# Patient Record
Sex: Female | Born: 1950
Health system: Southern US, Community
[De-identification: ages and names within clinical notes are randomized; demographics above are authoritative.]

## PROBLEM LIST (undated history)

## (undated) DIAGNOSIS — D649 Anemia, unspecified: Secondary | ICD-10-CM

## (undated) DIAGNOSIS — I10 Essential (primary) hypertension: Secondary | ICD-10-CM

## (undated) DIAGNOSIS — K219 Gastro-esophageal reflux disease without esophagitis: Secondary | ICD-10-CM

## (undated) DIAGNOSIS — H18519 Endothelial corneal dystrophy, unspecified eye: Secondary | ICD-10-CM

## (undated) DIAGNOSIS — Z8619 Personal history of other infectious and parasitic diseases: Secondary | ICD-10-CM

## (undated) DIAGNOSIS — J309 Allergic rhinitis, unspecified: Secondary | ICD-10-CM

## (undated) DIAGNOSIS — M1990 Unspecified osteoarthritis, unspecified site: Secondary | ICD-10-CM

## (undated) HISTORY — DX: Anemia, unspecified: D64.9

## (undated) HISTORY — DX: Gastro-esophageal reflux disease without esophagitis: K21.9

## (undated) HISTORY — DX: Essential (primary) hypertension: I10

## (undated) HISTORY — DX: Allergic rhinitis, unspecified: J30.9

## (undated) HISTORY — PX: COLONOSCOPY: SHX174

## (undated) HISTORY — DX: Unspecified osteoarthritis, unspecified site: M19.90

## (undated) HISTORY — DX: Personal history of other infectious and parasitic diseases: Z86.19

---

## 2005-01-18 ENCOUNTER — Ambulatory Visit (HOSPITAL_COMMUNITY): Admission: RE | Admit: 2005-01-18 | Discharge: 2005-01-18 | Payer: Self-pay | Admitting: Obstetrics and Gynecology

## 2005-03-27 ENCOUNTER — Ambulatory Visit: Payer: Self-pay | Admitting: Endocrinology

## 2005-04-02 ENCOUNTER — Ambulatory Visit: Payer: Self-pay | Admitting: Endocrinology

## 2006-05-13 ENCOUNTER — Ambulatory Visit (HOSPITAL_COMMUNITY): Admission: RE | Admit: 2006-05-13 | Discharge: 2006-05-13 | Payer: Self-pay | Admitting: Obstetrics and Gynecology

## 2006-05-13 ENCOUNTER — Ambulatory Visit: Payer: Self-pay | Admitting: Endocrinology

## 2006-05-14 ENCOUNTER — Ambulatory Visit: Payer: Self-pay | Admitting: Endocrinology

## 2006-06-04 ENCOUNTER — Ambulatory Visit: Payer: Self-pay | Admitting: Internal Medicine

## 2007-05-15 ENCOUNTER — Encounter: Payer: Self-pay | Admitting: Endocrinology

## 2007-05-15 DIAGNOSIS — D649 Anemia, unspecified: Secondary | ICD-10-CM

## 2007-05-15 DIAGNOSIS — J309 Allergic rhinitis, unspecified: Secondary | ICD-10-CM | POA: Insufficient documentation

## 2007-05-19 ENCOUNTER — Ambulatory Visit: Payer: Self-pay | Admitting: Endocrinology

## 2007-05-19 LAB — CONVERTED CEMR LAB
ALT: 18 units/L (ref 0–35)
Albumin: 3.8 g/dL (ref 3.5–5.2)
Alkaline Phosphatase: 81 units/L (ref 39–117)
BUN: 8 mg/dL (ref 6–23)
Bacteria, UA: NEGATIVE
Basophils Absolute: 0 10*3/uL (ref 0.0–0.1)
Bilirubin Urine: NEGATIVE
Bilirubin, Direct: 0.1 mg/dL (ref 0.0–0.3)
Calcium: 9.2 mg/dL (ref 8.4–10.5)
Cholesterol: 180 mg/dL (ref 0–200)
Crystals: NEGATIVE
Eosinophils Absolute: 0.1 10*3/uL (ref 0.0–0.6)
Eosinophils Relative: 1.2 % (ref 0.0–5.0)
GFR calc non Af Amer: 110 mL/min
Hep B S Ab: NEGATIVE
Ketones, ur: NEGATIVE mg/dL
Lymphocytes Relative: 33.4 % (ref 12.0–46.0)
MCV: 86.6 fL (ref 78.0–100.0)
Nitrite: NEGATIVE
RBC: 4.51 M/uL (ref 3.87–5.11)
Sed Rate: 32 mm/hr — ABNORMAL HIGH (ref 0–25)
Total Bilirubin: 0.5 mg/dL (ref 0.3–1.2)
Total CHOL/HDL Ratio: 3.8
Total Protein, Urine: NEGATIVE mg/dL
Total Protein: 7.3 g/dL (ref 6.0–8.3)
Urine Glucose: NEGATIVE mg/dL
Urobilinogen, UA: 0.2 (ref 0.0–1.0)
VLDL: 30 mg/dL (ref 0–40)
WBC: 7.8 10*3/uL (ref 4.5–10.5)
pH: 5.5 (ref 5.0–8.0)

## 2007-07-15 ENCOUNTER — Telehealth (INDEPENDENT_AMBULATORY_CARE_PROVIDER_SITE_OTHER): Payer: Self-pay | Admitting: *Deleted

## 2007-09-24 ENCOUNTER — Ambulatory Visit: Payer: Self-pay | Admitting: Endocrinology

## 2007-10-22 ENCOUNTER — Ambulatory Visit: Payer: Self-pay | Admitting: Endocrinology

## 2007-11-27 ENCOUNTER — Ambulatory Visit: Payer: Self-pay | Admitting: Endocrinology

## 2007-11-27 DIAGNOSIS — Z780 Asymptomatic menopausal state: Secondary | ICD-10-CM | POA: Insufficient documentation

## 2007-11-27 DIAGNOSIS — M25569 Pain in unspecified knee: Secondary | ICD-10-CM

## 2007-11-27 DIAGNOSIS — I10 Essential (primary) hypertension: Secondary | ICD-10-CM | POA: Insufficient documentation

## 2007-12-01 ENCOUNTER — Encounter: Payer: Self-pay | Admitting: Endocrinology

## 2007-12-02 ENCOUNTER — Encounter: Payer: Self-pay | Admitting: Endocrinology

## 2007-12-02 ENCOUNTER — Ambulatory Visit: Payer: Self-pay | Admitting: Family Medicine

## 2007-12-03 ENCOUNTER — Ambulatory Visit: Payer: Self-pay | Admitting: Endocrinology

## 2007-12-03 ENCOUNTER — Ambulatory Visit (HOSPITAL_COMMUNITY): Admission: RE | Admit: 2007-12-03 | Discharge: 2007-12-03 | Payer: Self-pay | Admitting: Obstetrics and Gynecology

## 2007-12-06 LAB — CONVERTED CEMR LAB
ALT: 15 units/L (ref 0–35)
AST: 21 units/L (ref 0–37)
Basophils Absolute: 0 10*3/uL (ref 0.0–0.1)
Basophils Relative: 0.8 % (ref 0.0–1.0)
Bilirubin Urine: NEGATIVE
CO2: 29 meq/L (ref 19–32)
Cholesterol: 191 mg/dL (ref 0–200)
Eosinophils Absolute: 0.1 10*3/uL (ref 0.0–0.6)
Eosinophils Relative: 2 % (ref 0.0–5.0)
GFR calc non Af Amer: 92 mL/min
HCT: 41.2 % (ref 36.0–46.0)
HDL: 55.9 mg/dL (ref >39.0)
Hemoglobin: 13.6 g/dL (ref 12.0–15.0)
Ketones, ur: NEGATIVE mg/dL
LDL Cholesterol: 115 mg/dL — ABNORMAL HIGH (ref 0–99)
Leukocytes, UA: NEGATIVE
MCHC: 33 g/dL (ref 30.0–36.0)
MCV: 88.2 fL (ref 78.0–100.0)
Neutro Abs: 3.6 10*3/uL (ref 1.4–7.7)
Neutrophils Relative %: 58.9 % (ref 43.0–77.0)
Nitrite: NEGATIVE
Potassium: 5 meq/L (ref 3.5–5.1)
RDW: 12 % (ref 11.5–14.6)
Sed Rate: 18 mm/hr (ref 0–25)
Specific Gravity, Urine: 1.025 (ref 1.000–1.03)
TSH: 0.96 microintl units/mL (ref 0.35–5.50)
Total Bilirubin: 0.6 mg/dL (ref 0.3–1.2)
Total Protein, Urine: NEGATIVE mg/dL
Total Protein: 7.2 g/dL (ref 6.0–8.3)
Uric Acid, Serum: 5.3 mg/dL (ref 2.4–7.0)
VLDL: 20 mg/dL (ref 0–40)

## 2007-12-15 ENCOUNTER — Telehealth (INDEPENDENT_AMBULATORY_CARE_PROVIDER_SITE_OTHER): Payer: Self-pay | Admitting: *Deleted

## 2007-12-30 ENCOUNTER — Encounter: Payer: Self-pay | Admitting: Endocrinology

## 2008-03-17 ENCOUNTER — Ambulatory Visit: Payer: Self-pay | Admitting: Endocrinology

## 2008-03-17 ENCOUNTER — Telehealth (INDEPENDENT_AMBULATORY_CARE_PROVIDER_SITE_OTHER): Payer: Self-pay | Admitting: *Deleted

## 2008-03-17 DIAGNOSIS — N3000 Acute cystitis without hematuria: Secondary | ICD-10-CM | POA: Insufficient documentation

## 2008-03-17 LAB — CONVERTED CEMR LAB
Glucose, Urine, Semiquant: NEGATIVE
Protein, U semiquant: NEGATIVE
Urobilinogen, UA: 0.2

## 2008-03-18 ENCOUNTER — Encounter: Payer: Self-pay | Admitting: Endocrinology

## 2008-05-27 ENCOUNTER — Ambulatory Visit: Payer: Self-pay | Admitting: Endocrinology

## 2008-07-15 ENCOUNTER — Ambulatory Visit: Payer: Self-pay | Admitting: Endocrinology

## 2009-03-04 IMAGING — MG MM DIGITAL SCREENING BILAT
4 series · 4 of 4 positions shown · non-contrast
Comparison: Prior studies.

DG SCREEN MAMMOGRAM BILATERAL
Bilateral CC and MLO view(s) were taken.

DIGITAL SCREENING MAMMOGRAM WITH CAD:

[R CC]
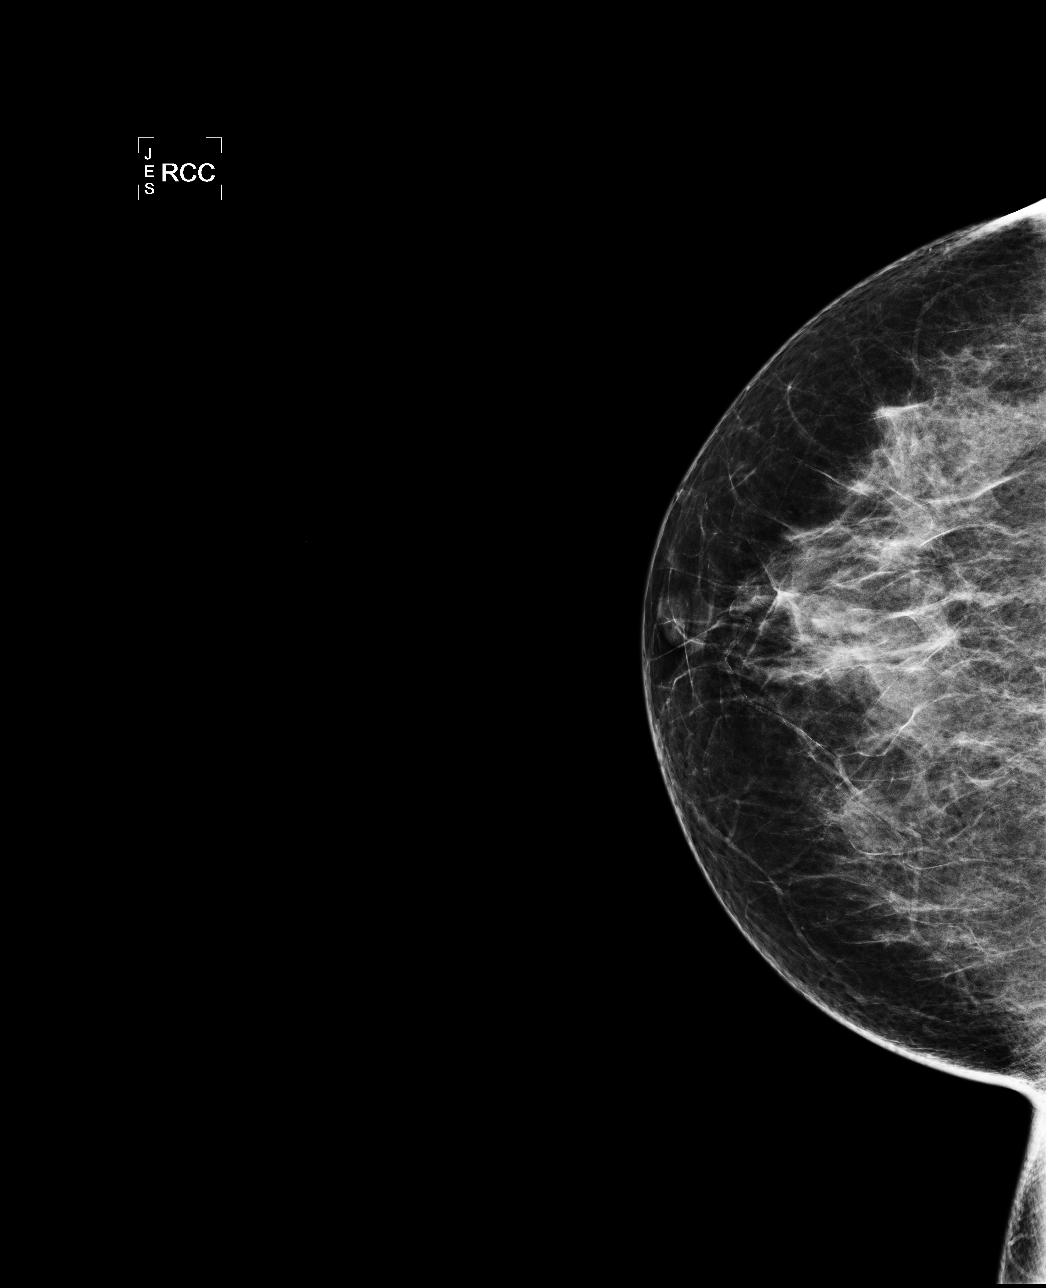

[R MLO]
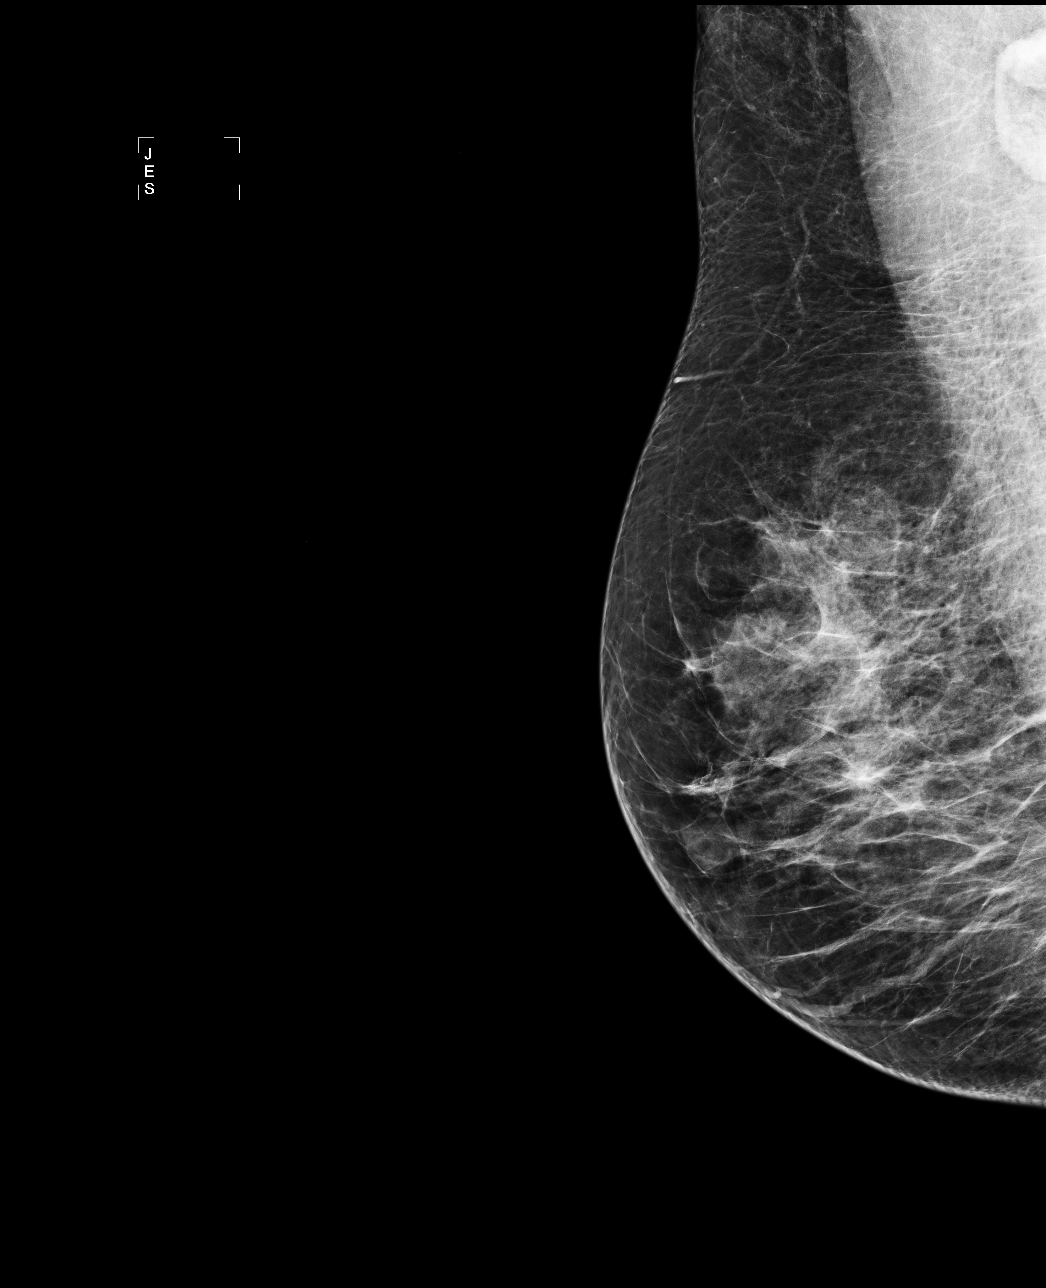

[L CC]
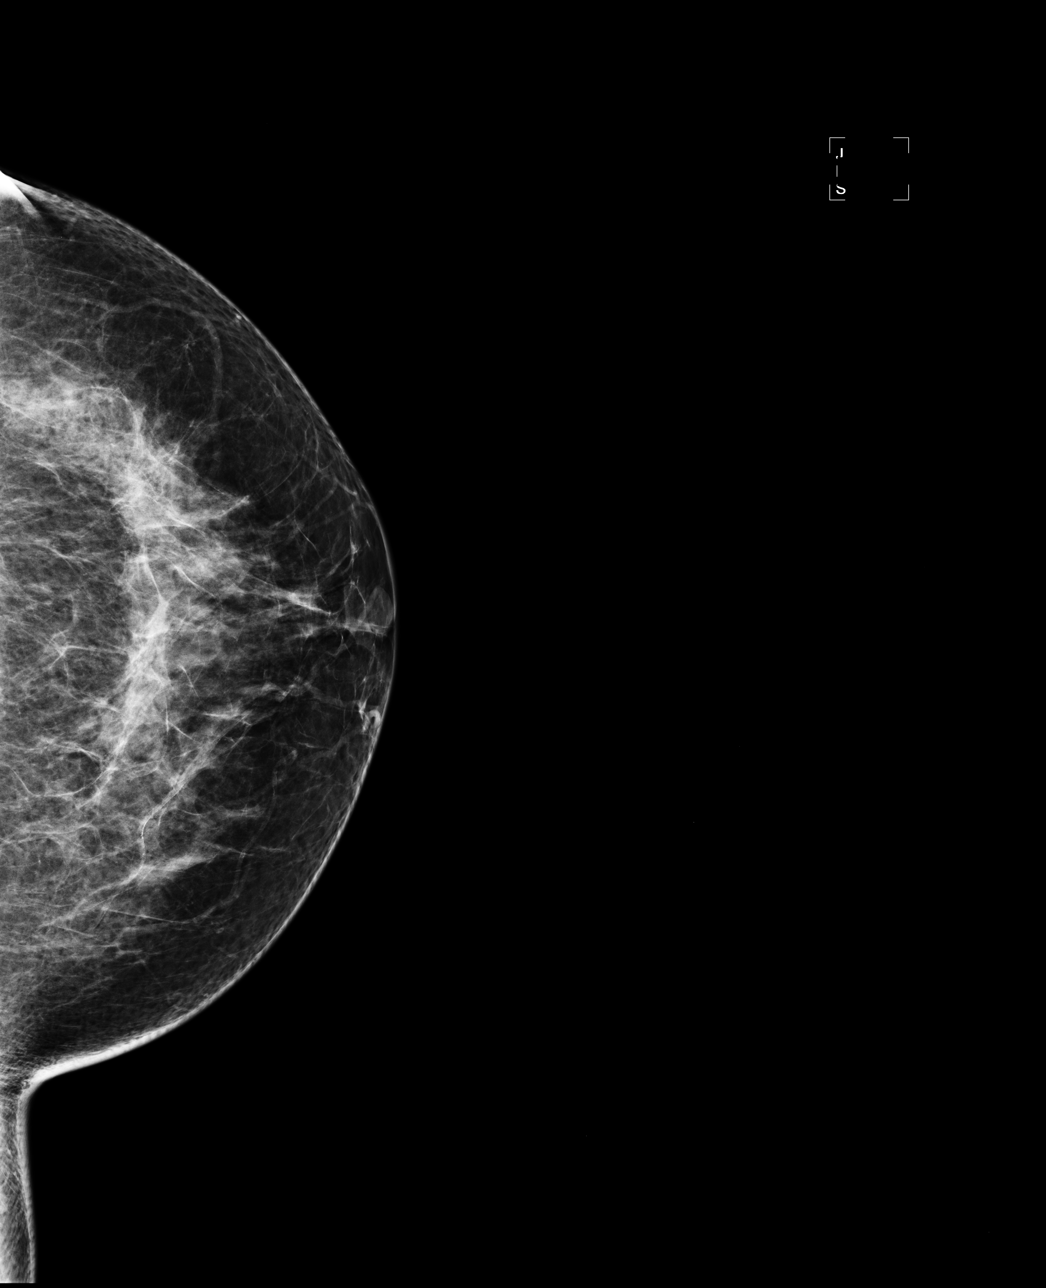

[L MLO]
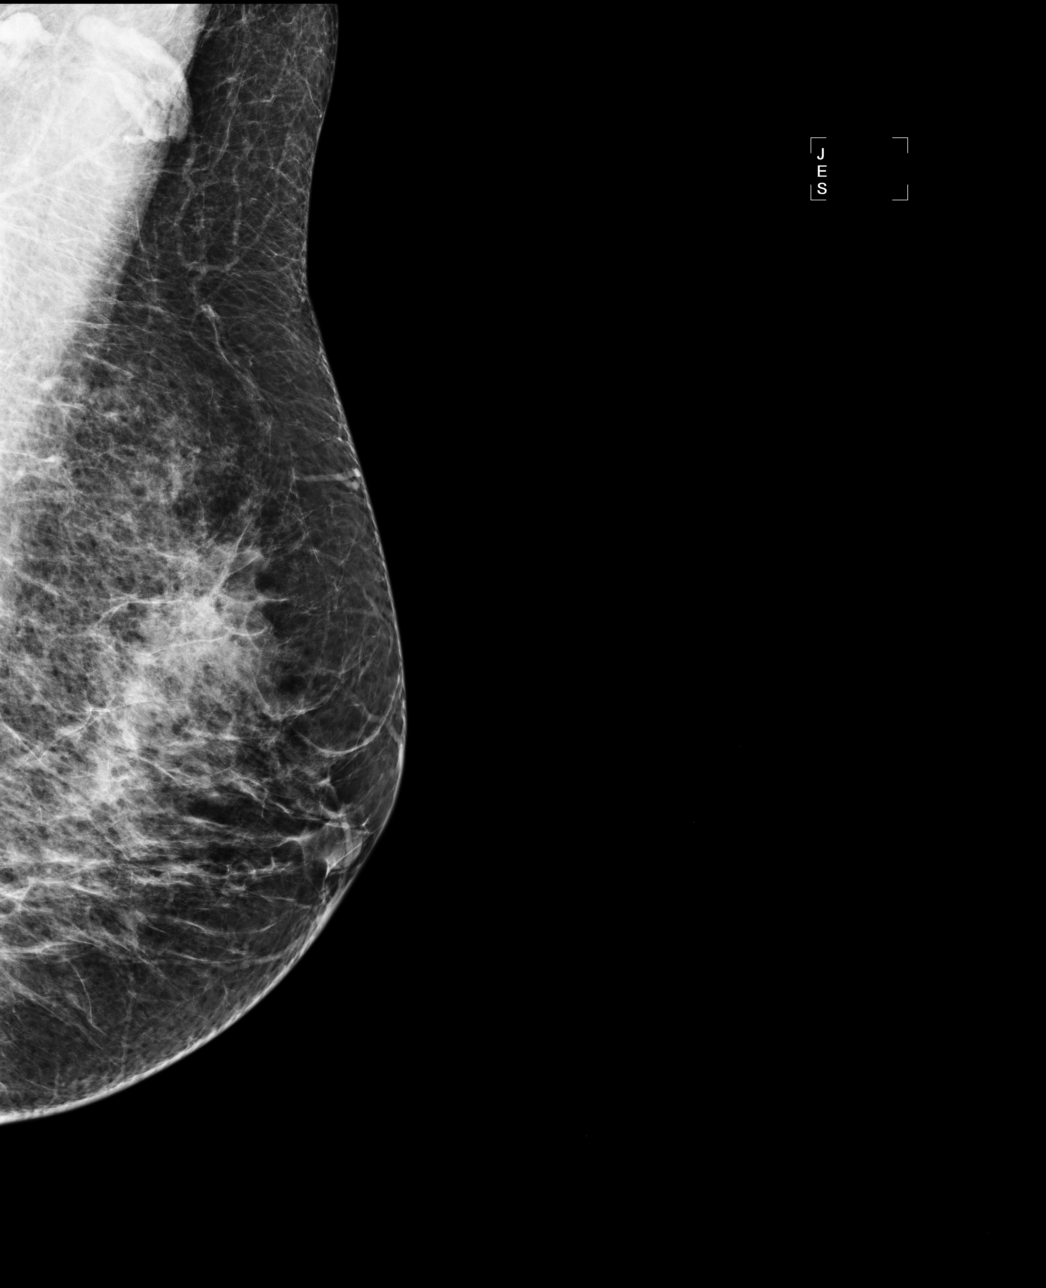

[4 of 4 positions shown; findings below may reference images not displayed]

The breast tissue is heterogeneously dense.  There is no dominant mass, architectural distortion or
calcification to suggest malignancy.
IMPRESSION: No mammographic evidence of malignancy.  Suggest yearly screening mammography.

ASSESSMENT: Negative - BI-RADS 1

Screening mammogram in 1 year.
ANALYZED BY COMPUTER AIDED DETECTION. , THIS PROCEDURE WAS A DIGITAL MAMMOGRAM.

## 2010-10-07 ENCOUNTER — Encounter: Payer: Self-pay | Admitting: Obstetrics and Gynecology

## 2010-10-08 ENCOUNTER — Encounter: Payer: Self-pay | Admitting: Obstetrics and Gynecology

## 2010-10-16 NOTE — Progress Notes (Signed)
Summary: rx needed  Phone Note Call from Patient Call back at Home Phone [phone] Call back at [phone]/cell daughter   Caller: Daughter Summary of Call: pt daughter requesting an rx for alrex for eye allgeries.and lotrisone for her itching under her arm pits .Patient's chart has been requested.  Initial call taken by: Shelbie Proctor,  July 15, 2007 9:55 AM  Follow-up for Phone Call        please offer ov sat 07/18/07 Follow-up by: Minus Breeding MD,  July 17, 2007 3:19 PM  Additional Follow-up for Phone Call Additional follow up Details #1::        called  daughter  per pt daughter can not bring her in sat  she is working the daughter  Additional Follow-up by: Shelbie Proctor,  July 17, 2007 4:32 PM

## 2011-10-11 ENCOUNTER — Telehealth: Payer: Self-pay | Admitting: Endocrinology

## 2011-10-11 NOTE — Telephone Encounter (Signed)
Daughter is aware and has made an appt for Jan 29.  Aware Medicaid must be Reg. And not [place] [facility].

## 2011-10-11 NOTE — Telephone Encounter (Signed)
Kristilyn Coltrane last was seen 03-17-2008.  The daughter thinks Dr Everardo All was her PCP.  She has just gotten a new Medicaid card.  The daughter states it is reg. Medicaid and not [place] [facility].  Is it ok for her to re-establish for primary care.

## 2011-10-11 NOTE — Telephone Encounter (Signed)
ok 

## 2011-10-15 ENCOUNTER — Ambulatory Visit (INDEPENDENT_AMBULATORY_CARE_PROVIDER_SITE_OTHER): Payer: Medicaid Other | Admitting: Endocrinology

## 2011-10-15 ENCOUNTER — Ambulatory Visit (INDEPENDENT_AMBULATORY_CARE_PROVIDER_SITE_OTHER)
Admission: RE | Admit: 2011-10-15 | Discharge: 2011-10-15 | Disposition: A | Discharge status: Home / Self Care | Payer: Medicaid Other | Source: Ambulatory Visit | Attending: Endocrinology | Admitting: Endocrinology

## 2011-10-15 ENCOUNTER — Other Ambulatory Visit (INDEPENDENT_AMBULATORY_CARE_PROVIDER_SITE_OTHER): Payer: Medicaid Other

## 2011-10-15 ENCOUNTER — Encounter: Payer: Self-pay | Admitting: Endocrinology

## 2011-10-15 DIAGNOSIS — Z780 Asymptomatic menopausal state: Secondary | ICD-10-CM

## 2011-10-15 DIAGNOSIS — M25569 Pain in unspecified knee: Secondary | ICD-10-CM

## 2011-10-15 DIAGNOSIS — N951 Menopausal and female climacteric states: Secondary | ICD-10-CM

## 2011-10-15 DIAGNOSIS — H04129 Dry eye syndrome of unspecified lacrimal gland: Secondary | ICD-10-CM

## 2011-10-15 DIAGNOSIS — I10 Essential (primary) hypertension: Secondary | ICD-10-CM

## 2011-10-15 DIAGNOSIS — M542 Cervicalgia: Secondary | ICD-10-CM

## 2011-10-15 DIAGNOSIS — D649 Anemia, unspecified: Secondary | ICD-10-CM

## 2011-10-15 DIAGNOSIS — Z79899 Other long term (current) drug therapy: Secondary | ICD-10-CM

## 2011-10-15 LAB — HEPATIC FUNCTION PANEL
AST: 23 U/L (ref 0–37)
Albumin: 4.1 g/dL (ref 3.5–5.2)
Bilirubin, Direct: 0 mg/dL (ref 0.0–0.3)
Total Bilirubin: 0.5 mg/dL (ref 0.3–1.2)
Total Protein: 7.8 g/dL (ref 6.0–8.3)

## 2011-10-15 LAB — URINALYSIS, ROUTINE W REFLEX MICROSCOPIC
Ketones, ur: NEGATIVE
Leukocytes, UA: NEGATIVE
Specific Gravity, Urine: 1.01 (ref 1.000–1.030)
Total Protein, Urine: NEGATIVE
Urine Glucose: NEGATIVE
Urobilinogen, UA: 0.2 (ref 0.0–1.0)
pH: 7 (ref 5.0–8.0)

## 2011-10-15 LAB — CBC WITH DIFFERENTIAL/PLATELET
Eosinophils Absolute: 0.1 10*3/uL (ref 0.0–0.7)
Hemoglobin: 13.4 g/dL (ref 12.0–15.0)
Lymphs Abs: 1.8 10*3/uL (ref 0.7–4.0)
MCHC: 34.1 g/dL (ref 30.0–36.0)
Monocytes Absolute: 0.5 10*3/uL (ref 0.1–1.0)
Neutro Abs: 5.2 10*3/uL (ref 1.4–7.7)
RBC: 4.36 Mil/uL (ref 3.87–5.11)
RDW: 12.6 % (ref 11.5–14.6)
WBC: 7.7 10*3/uL (ref 4.5–10.5)

## 2011-10-15 LAB — IBC PANEL
Saturation Ratios: 23.8 % (ref 20.0–50.0)
Transferrin: 270 mg/dL (ref 212.0–360.0)

## 2011-10-15 LAB — TSH: TSH: 0.97 u[IU]/mL (ref 0.35–5.50)

## 2011-10-15 LAB — LIPID PANEL
Cholesterol: 212 mg/dL — ABNORMAL HIGH (ref 0–200)
HDL: 57.9 mg/dL (ref >39.00)
Total CHOL/HDL Ratio: 4
Triglycerides: 133 mg/dL (ref 0.0–149.0)

## 2011-10-15 LAB — BASIC METABOLIC PANEL
Calcium: 9.4 mg/dL (ref 8.4–10.5)
Glucose, Bld: 89 mg/dL (ref 70–99)

## 2011-10-15 MED ORDER — CLOTRIMAZOLE-BETAMETHASONE 1-0.05 % EX CREA: TOPICAL_CREAM | Freq: Two times a day (BID) | CUTANEOUS | Status: AC

## 2011-10-15 MED ORDER — LISINOPRIL 10 MG PO TABS: 10.0000 mg | ORAL_TABLET | Freq: Every day | ORAL | Status: DC

## 2011-10-15 NOTE — Progress Notes (Signed)
Subjective:    Patient ID: Natalie Good, female    DOB: 1951/01/24, 61 y.o.   MRN: [id number]  HPI Pt states few mos of moderate pain at the right knee, but no assoc numbness. She saw ortho 5 years ago--dx was oa. Past Medical History  Diagnosis Date  . ALLERGIC RHINITIS   . HYPERTENSION   . ANEMIA-NOS   . Arthritis   . History of chicken pox   . GERD (gastroesophageal reflux disease)     No past surgical history on file.  History   Social History  . Marital Status: Married    Spouse Name: N/A    Number of Children: N/A  . Years of Education: 12   Occupational History  . Unemployed    Social History Main Topics  . Smoking status: Never Smoker   . Smokeless tobacco: Not on file  . Alcohol Use: No  . Drug Use: No  . Sexually Active: Not on file   Other Topics Concern  . Not on file   Social History Narrative   Regular exercise-yes    Current Outpatient Prescriptions on File Prior to Visit  Medication Sig Dispense Refill  . clotrimazole-betamethasone (LOTRISONE) cream Apply topically 2 (two) times daily.  30 g  2  . fexofenadine (ALLEGRA) 180 MG tablet Take 180 mg by mouth daily.      Marland Kitchen lisinopril (PRINIVIL,ZESTRIL) 10 MG tablet Take 1 tablet (10 mg total) by mouth daily.  30 tablet  2  . montelukast (SINGULAIR) 10 MG tablet Take 10 mg by mouth as needed.      . ranitidine (ZANTAC) 150 MG tablet Take 150 mg by mouth daily.      . sodium chloride (MURO 128) 2 % ophthalmic solution Apply 1 drop to eye.        Allergies  Allergen Reactions  . Nsaids     REACTION: Hives    Family History  Problem Relation Age of Onset  . Cancer Mother     Breast Cancer  . Arthritis Other     Parent  . Hyperlipidemia Other     Parents  . Stroke Other     Parent  . Hypertension Other     Parent    BP 122/82  Pulse 72  Temp(Src) 97.6 F (36.4 C) (Oral)  Ht 5\' 5"  (1.651 m)  Wt 144 lb 12.8 oz (65.681 kg)  BMI 24.10 kg/m2  SpO2 97%    Review of Systems Pt state 2  years of intermittent pain at the right lateral neck.  No fever.     Objective:   Physical Exam VITAL SIGNS:  See vs page GENERAL: no distress NECK: There is no palpable thyroid enlargement.  No thyroid nodule is palpable.  No palpable lymphadenopathy at the neck. Right knee:  No tend/warmth.  There is slight swelling. Gait: favors right leg   Lab Results  Component Value Date   WBC 7.7 10/15/2011   HGB 13.4 10/15/2011   HCT 39.4 10/15/2011   PLT 198.0 10/15/2011   GLUCOSE 89 10/15/2011   CHOL 212* 10/15/2011   TRIG 133.0 10/15/2011   HDL 57.90 10/15/2011   LDLDIRECT 133.3 10/15/2011   LDLCALC 115* 12/03/2007   ALT 16 10/15/2011   AST 23 10/15/2011   NA 139 10/15/2011   K 5.0 10/15/2011   CL 104 10/15/2011   CREATININE 0.5 10/15/2011   BUN 13 10/15/2011   CO2 29 10/15/2011   TSH 0.97 10/15/2011  Assessment & Plan:  OA, with persistent pain Neck pain, uually due to oa Dyslipidemia, needs increased rx Anemia, resolved HTN, well-controlled

## 2011-10-15 NOTE — Patient Instructions (Addendum)
blood tests, and x-rays are being requested for you today.  please call [phone] to hear your test results.  You will be prompted to enter the 9-digit "MRN" number that appears at the top left of this page, followed by #.  Then you will hear the message. Please come back for a follow-up appointment for 1 month.  Please make an appointment. Refer to a eye specialist.  you will receive a phone call, about a day and time for an appointment. (update: i left message on phone-tree:  i advised pravachol)

## 2011-10-18 ENCOUNTER — Ambulatory Visit (INDEPENDENT_AMBULATORY_CARE_PROVIDER_SITE_OTHER): Payer: Medicaid Other | Admitting: Internal Medicine

## 2011-10-18 ENCOUNTER — Encounter: Payer: Self-pay | Admitting: Internal Medicine

## 2011-10-18 VITALS — BP 122/78 | HR 85 | Temp 97.6°F

## 2011-10-18 DIAGNOSIS — L501 Idiopathic urticaria: Secondary | ICD-10-CM

## 2011-10-18 DIAGNOSIS — J351 Hypertrophy of tonsils: Secondary | ICD-10-CM

## 2011-10-18 MED ORDER — LIDOCAINE HCL 2 % EX GEL: CUTANEOUS | Status: AC | PRN

## 2011-10-18 NOTE — Patient Instructions (Signed)
It was good to see you today. Use lidocaine jelly topically as needed for throat pain and tramadol as needed for neck pain - Your prescription(s) have been submitted to your pharmacy. Please take as directed and contact our office if you believe you are having problem(s) with the medication(s). Will refer to ear nose and throat specialist to consider biopsy of tonsil as needed - Our office will contact you regarding appointment(s) once made. If you develop worsening symptoms or fever, call and we can reconsider antibiotics, but it does not appear necessary to use antibiotics at this time.

## 2011-10-18 NOTE — Progress Notes (Signed)
Subjective:    Patient ID: Natalie Good, female    DOB: Oct 27, 1950, 61 y.o.   MRN: [id number]  HPI Complains of right-sided throat and neck pain Onset of current symptoms 3-4 days ago Symptoms are rapidly progressive pain and gland swelling Not associated with fever or upper respiratory symptoms: specifically no rhinorrhea, nasal congestion or headache No sick contacts Recurrent symptoms of right-sided tonsillar swelling 2-3 times annually but never lasting for this many days (usually <2 days) Associated with difficulty swallowing due to pain Also swelling of glands on right-sided neck and discomfort in upper shoulder and side of head No recent urticaria but has history of recurrent idiopathic hives for which she follows with Dr. Fullerton Callas  Past Medical History  Diagnosis Date  . ALLERGIC RHINITIS   . HYPERTENSION   . ANEMIA-NOS   . Arthritis   . History of chicken pox   . GERD (gastroesophageal reflux disease)     Review of Systems  Constitutional: Negative for fever, fatigue and unexpected weight change.  HENT: Positive for ear pain (right x 4 days) and neck pain (r anterior neck x 4 days). Negative for hearing loss, nosebleeds, facial swelling, rhinorrhea, sneezing, neck stiffness, dental problem, postnasal drip, sinus pressure, tinnitus and ear discharge.   Eyes: Negative for redness and itching.       Objective:   Physical Exam BP 122/78  Pulse 85  Temp(Src) 97.6 F (36.4 C) (Oral)  SpO2 99% Wt Readings from Last 3 Encounters:  10/15/11 144 lb 12.8 oz (65.681 kg)  03/17/08 147 lb (66.679 kg)  11/27/07 152 lb 12.8 oz (69.31 kg)   Constitutional: She appears well-developed and well-nourished. Nontoxic and no distress. Dtr and g-dtr at side HENT: Head: Normocephalic and atraumatic. Ears: B TMs ok, no erythema or effusion; Nose: Nose normal. Mouth/Throat: Oropharynx is clear and moist. R tonsil enlarged with 1cm round ulcerative erythema, no exudate.  Eyes: Conjunctivae and  EOM are normal. Pupils are equal, round, and reactive to light. No scleral icterus.  Neck: swollen and tender LAD along right side but normal range of motion. Neck supple. No JVD present. No thyromegaly present.  Cardiovascular: Normal rate, regular rhythm and normal heart sounds.  No murmur heard. No BLE edema. Pulmonary/Chest: Effort normal and breath sounds normal. No respiratory distress. She has no wheezes.  Neurological: She is alert and oriented to person, place, and time. No cranial nerve deficit. Coordination normal.  Skin: Skin is warm and dry. No rash noted. No erythema.  Psychiatric: She has a normal mood and affect. Her behavior is normal. Judgment and thought content normal.   Lab Results  Component Value Date   WBC 7.7 10/15/2011   HGB 13.4 10/15/2011   HCT 39.4 10/15/2011   PLT 198.0 10/15/2011   GLUCOSE 89 10/15/2011   CHOL 212* 10/15/2011   TRIG 133.0 10/15/2011   HDL 57.90 10/15/2011   LDLDIRECT 133.3 10/15/2011   LDLCALC 115* 12/03/2007   ALT 16 10/15/2011   AST 23 10/15/2011   NA 139 10/15/2011   K 5.0 10/15/2011   CL 104 10/15/2011   CREATININE 0.5 10/15/2011   BUN 13 10/15/2011   CO2 29 10/15/2011   TSH 0.97 10/15/2011       Assessment & Plan:  Right tonsillitis - associated with significant lymphadenopathy but no fever or exudate Recurrent history of same 2-3 times each year in last several years, usually spont resolve <72h ? Related to recurrent idiopathic hives, but no skin outbreaks at this  time  Refer to ENT to consider biopsy Symptomatic care with lidocaine jelly and tramadol as needed Recent labs reviewed: no evidence for active infection qnd no fever so will hold empiric antibiotics or antiviral therpies until diagnosis clarified

## 2011-10-21 ENCOUNTER — Ambulatory Visit: Payer: Medicaid Other | Admitting: Internal Medicine

## 2011-12-08 ENCOUNTER — Other Ambulatory Visit: Payer: Self-pay | Admitting: Endocrinology

## 2012-01-03 ENCOUNTER — Other Ambulatory Visit: Payer: Self-pay | Admitting: Family Medicine

## 2012-01-03 DIAGNOSIS — Z1231 Encounter for screening mammogram for malignant neoplasm of breast: Secondary | ICD-10-CM

## 2012-01-03 DIAGNOSIS — Z780 Asymptomatic menopausal state: Secondary | ICD-10-CM

## 2012-01-16 ENCOUNTER — Ambulatory Visit
Admission: RE | Admit: 2012-01-16 | Discharge: 2012-01-16 | Disposition: A | Discharge status: Home / Self Care | Payer: Medicaid Other | Source: Ambulatory Visit | Attending: Family Medicine | Admitting: Family Medicine

## 2012-01-16 DIAGNOSIS — Z780 Asymptomatic menopausal state: Secondary | ICD-10-CM

## 2012-01-16 DIAGNOSIS — Z1231 Encounter for screening mammogram for malignant neoplasm of breast: Secondary | ICD-10-CM

## 2013-02-02 ENCOUNTER — Other Ambulatory Visit: Payer: Self-pay | Admitting: Family Medicine

## 2013-02-02 DIAGNOSIS — Z1231 Encounter for screening mammogram for malignant neoplasm of breast: Secondary | ICD-10-CM

## 2013-03-04 ENCOUNTER — Ambulatory Visit
Admission: RE | Admit: 2013-03-04 | Discharge: 2013-03-04 | Disposition: A | Discharge status: Home / Self Care | Payer: No Typology Code available for payment source | Source: Ambulatory Visit | Attending: Family Medicine | Admitting: Family Medicine

## 2013-03-04 DIAGNOSIS — Z1231 Encounter for screening mammogram for malignant neoplasm of breast: Secondary | ICD-10-CM

## 2013-09-16 HISTORY — PX: EYE SURGERY: SHX253

## 2014-02-02 ENCOUNTER — Ambulatory Visit: Payer: BC Managed Care – PPO | Attending: Orthopaedic Surgery | Admitting: Physical Therapy

## 2014-02-02 DIAGNOSIS — M25619 Stiffness of unspecified shoulder, not elsewhere classified: Secondary | ICD-10-CM | POA: Insufficient documentation

## 2014-02-02 DIAGNOSIS — IMO0001 Reserved for inherently not codable concepts without codable children: Secondary | ICD-10-CM | POA: Insufficient documentation

## 2014-02-02 DIAGNOSIS — M25519 Pain in unspecified shoulder: Secondary | ICD-10-CM | POA: Insufficient documentation

## 2014-02-03 ENCOUNTER — Ambulatory Visit: Payer: BC Managed Care – PPO | Admitting: Physical Therapy

## 2014-02-09 ENCOUNTER — Ambulatory Visit: Payer: BC Managed Care – PPO | Admitting: Physical Therapy

## 2014-02-10 ENCOUNTER — Ambulatory Visit: Payer: BC Managed Care – PPO | Admitting: Physical Therapy

## 2014-02-14 ENCOUNTER — Ambulatory Visit: Payer: BC Managed Care – PPO | Attending: Orthopaedic Surgery | Admitting: Physical Therapy

## 2014-02-14 DIAGNOSIS — M25619 Stiffness of unspecified shoulder, not elsewhere classified: Secondary | ICD-10-CM | POA: Insufficient documentation

## 2014-02-14 DIAGNOSIS — M25519 Pain in unspecified shoulder: Secondary | ICD-10-CM | POA: Insufficient documentation

## 2014-02-14 DIAGNOSIS — Z5189 Encounter for other specified aftercare: Secondary | ICD-10-CM | POA: Insufficient documentation

## 2014-02-16 ENCOUNTER — Ambulatory Visit: Payer: BC Managed Care – PPO | Admitting: Physical Therapy

## 2014-02-18 ENCOUNTER — Ambulatory Visit: Payer: BC Managed Care – PPO | Admitting: Physical Therapy

## 2014-02-21 ENCOUNTER — Ambulatory Visit: Payer: BC Managed Care – PPO | Admitting: Physical Therapy

## 2014-02-23 ENCOUNTER — Ambulatory Visit: Payer: BC Managed Care – PPO | Admitting: Physical Therapy

## 2014-02-24 ENCOUNTER — Encounter: Payer: BC Managed Care – PPO | Admitting: Physical Therapy

## 2014-03-01 ENCOUNTER — Encounter: Payer: BC Managed Care – PPO | Admitting: Physical Therapy

## 2014-03-02 ENCOUNTER — Encounter: Payer: BC Managed Care – PPO | Admitting: Physical Therapy

## 2014-03-04 ENCOUNTER — Encounter: Payer: BC Managed Care – PPO | Admitting: Physical Therapy

## 2014-03-14 ENCOUNTER — Ambulatory Visit: Payer: BC Managed Care – PPO | Admitting: Rehabilitation

## 2014-03-15 ENCOUNTER — Ambulatory Visit: Payer: BC Managed Care – PPO | Admitting: Rehabilitation

## 2014-03-17 ENCOUNTER — Ambulatory Visit: Payer: BC Managed Care – PPO | Attending: Orthopaedic Surgery | Admitting: Physical Therapy

## 2014-03-17 DIAGNOSIS — Z5189 Encounter for other specified aftercare: Secondary | ICD-10-CM | POA: Insufficient documentation

## 2014-03-17 DIAGNOSIS — M25519 Pain in unspecified shoulder: Secondary | ICD-10-CM | POA: Insufficient documentation

## 2014-03-17 DIAGNOSIS — M25619 Stiffness of unspecified shoulder, not elsewhere classified: Secondary | ICD-10-CM | POA: Insufficient documentation

## 2014-03-21 ENCOUNTER — Ambulatory Visit: Payer: BC Managed Care – PPO

## 2014-03-22 ENCOUNTER — Encounter: Payer: BC Managed Care – PPO | Admitting: Physical Therapy

## 2014-03-23 ENCOUNTER — Ambulatory Visit: Payer: BC Managed Care – PPO | Admitting: Rehabilitation

## 2014-03-24 ENCOUNTER — Ambulatory Visit: Payer: BC Managed Care – PPO | Admitting: Physical Therapy

## 2014-03-30 ENCOUNTER — Ambulatory Visit: Payer: BC Managed Care – PPO | Admitting: Physical Therapy

## 2014-04-05 ENCOUNTER — Ambulatory Visit: Payer: BC Managed Care – PPO

## 2014-04-08 ENCOUNTER — Ambulatory Visit: Payer: BC Managed Care – PPO | Admitting: Physical Therapy

## 2014-04-12 ENCOUNTER — Encounter: Payer: BC Managed Care – PPO | Admitting: Rehabilitation

## 2014-04-14 ENCOUNTER — Encounter: Payer: BC Managed Care – PPO | Admitting: Physical Therapy

## 2014-04-20 ENCOUNTER — Ambulatory Visit: Payer: BC Managed Care – PPO | Attending: Orthopaedic Surgery | Admitting: Rehabilitation

## 2014-04-20 DIAGNOSIS — M25519 Pain in unspecified shoulder: Secondary | ICD-10-CM | POA: Diagnosis not present

## 2014-04-20 DIAGNOSIS — Z5189 Encounter for other specified aftercare: Secondary | ICD-10-CM | POA: Diagnosis present

## 2014-04-20 DIAGNOSIS — M25619 Stiffness of unspecified shoulder, not elsewhere classified: Secondary | ICD-10-CM | POA: Insufficient documentation

## 2014-04-22 ENCOUNTER — Ambulatory Visit: Payer: BC Managed Care – PPO

## 2014-04-22 DIAGNOSIS — Z5189 Encounter for other specified aftercare: Secondary | ICD-10-CM | POA: Diagnosis not present

## 2014-04-25 ENCOUNTER — Ambulatory Visit: Payer: BC Managed Care – PPO | Admitting: Physical Therapy

## 2014-04-25 DIAGNOSIS — Z5189 Encounter for other specified aftercare: Secondary | ICD-10-CM | POA: Diagnosis not present

## 2014-04-26 ENCOUNTER — Ambulatory Visit: Payer: BC Managed Care – PPO | Admitting: Physical Therapy

## 2014-04-26 DIAGNOSIS — Z5189 Encounter for other specified aftercare: Secondary | ICD-10-CM | POA: Diagnosis not present

## 2014-05-02 ENCOUNTER — Encounter: Payer: BC Managed Care – PPO | Admitting: Physical Therapy

## 2014-05-03 ENCOUNTER — Ambulatory Visit: Payer: BC Managed Care – PPO | Admitting: Rehabilitation

## 2014-05-03 DIAGNOSIS — Z5189 Encounter for other specified aftercare: Secondary | ICD-10-CM | POA: Diagnosis not present

## 2014-06-09 ENCOUNTER — Other Ambulatory Visit: Payer: Self-pay

## 2014-06-09 DIAGNOSIS — Z1231 Encounter for screening mammogram for malignant neoplasm of breast: Secondary | ICD-10-CM

## 2014-06-10 ENCOUNTER — Ambulatory Visit: Payer: BC Managed Care – PPO

## 2014-06-10 ENCOUNTER — Ambulatory Visit
Admission: RE | Admit: 2014-06-10 | Discharge: 2014-06-10 | Disposition: A | Discharge status: Home / Self Care | Payer: BC Managed Care – PPO | Source: Ambulatory Visit

## 2014-06-10 DIAGNOSIS — Z1231 Encounter for screening mammogram for malignant neoplasm of breast: Secondary | ICD-10-CM

## 2014-06-30 ENCOUNTER — Other Ambulatory Visit: Payer: Self-pay | Admitting: Orthopaedic Surgery

## 2014-06-30 DIAGNOSIS — M25511 Pain in right shoulder: Secondary | ICD-10-CM

## 2014-07-10 ENCOUNTER — Other Ambulatory Visit: Payer: BC Managed Care – PPO

## 2016-03-13 DIAGNOSIS — K219 Gastro-esophageal reflux disease without esophagitis: Secondary | ICD-10-CM | POA: Diagnosis not present

## 2016-03-13 DIAGNOSIS — Z780 Asymptomatic menopausal state: Secondary | ICD-10-CM | POA: Diagnosis not present

## 2016-03-13 DIAGNOSIS — L2089 Other atopic dermatitis: Secondary | ICD-10-CM | POA: Diagnosis not present

## 2016-03-13 DIAGNOSIS — I10 Essential (primary) hypertension: Secondary | ICD-10-CM | POA: Diagnosis not present

## 2016-03-13 DIAGNOSIS — Z1239 Encounter for other screening for malignant neoplasm of breast: Secondary | ICD-10-CM | POA: Diagnosis not present

## 2016-03-14 ENCOUNTER — Other Ambulatory Visit: Payer: Self-pay | Admitting: Internal Medicine

## 2016-03-14 DIAGNOSIS — Z1231 Encounter for screening mammogram for malignant neoplasm of breast: Secondary | ICD-10-CM

## 2016-03-27 DIAGNOSIS — Z780 Asymptomatic menopausal state: Secondary | ICD-10-CM | POA: Diagnosis not present

## 2016-04-02 ENCOUNTER — Ambulatory Visit
Admission: RE | Admit: 2016-04-02 | Discharge: 2016-04-02 | Disposition: A | Discharge status: Home / Self Care | Payer: PPO | Source: Ambulatory Visit | Attending: Internal Medicine | Admitting: Internal Medicine

## 2016-04-02 ENCOUNTER — Other Ambulatory Visit: Payer: Self-pay | Admitting: Internal Medicine

## 2016-04-02 DIAGNOSIS — Z1231 Encounter for screening mammogram for malignant neoplasm of breast: Secondary | ICD-10-CM | POA: Diagnosis not present

## 2016-05-13 DIAGNOSIS — M25531 Pain in right wrist: Secondary | ICD-10-CM | POA: Diagnosis not present

## 2016-05-13 DIAGNOSIS — M79604 Pain in right leg: Secondary | ICD-10-CM | POA: Diagnosis not present

## 2016-05-13 DIAGNOSIS — L308 Other specified dermatitis: Secondary | ICD-10-CM | POA: Diagnosis not present

## 2017-01-06 ENCOUNTER — Telehealth (INDEPENDENT_AMBULATORY_CARE_PROVIDER_SITE_OTHER): Payer: Self-pay | Admitting: Orthopedic Surgery

## 2017-01-06 NOTE — Telephone Encounter (Signed)
Tried to call back to discuss. No answer. LM statin that I was returning call. No sure what is being asked here.

## 2017-01-06 NOTE — Telephone Encounter (Signed)
Patient's daughter Milagros Loll) called asked if Dr August Saucer will see her patient's at the same time for bil knee injections.  The number to contact Milagros Loll is [phone]

## 2017-01-08 DIAGNOSIS — I10 Essential (primary) hypertension: Secondary | ICD-10-CM | POA: Diagnosis not present

## 2017-01-08 DIAGNOSIS — D519 Vitamin B12 deficiency anemia, unspecified: Secondary | ICD-10-CM | POA: Diagnosis not present

## 2017-01-08 DIAGNOSIS — Z0000 Encounter for general adult medical examination without abnormal findings: Secondary | ICD-10-CM | POA: Diagnosis not present

## 2017-01-08 DIAGNOSIS — R8290 Unspecified abnormal findings in urine: Secondary | ICD-10-CM | POA: Diagnosis not present

## 2017-01-08 DIAGNOSIS — Z131 Encounter for screening for diabetes mellitus: Secondary | ICD-10-CM | POA: Diagnosis not present

## 2017-01-10 DIAGNOSIS — M170 Bilateral primary osteoarthritis of knee: Secondary | ICD-10-CM | POA: Diagnosis not present

## 2017-01-10 DIAGNOSIS — K219 Gastro-esophageal reflux disease without esophagitis: Secondary | ICD-10-CM | POA: Diagnosis not present

## 2017-01-10 DIAGNOSIS — R7989 Other specified abnormal findings of blood chemistry: Secondary | ICD-10-CM | POA: Diagnosis not present

## 2017-01-10 DIAGNOSIS — I10 Essential (primary) hypertension: Secondary | ICD-10-CM | POA: Diagnosis not present

## 2017-01-10 DIAGNOSIS — Z0000 Encounter for general adult medical examination without abnormal findings: Secondary | ICD-10-CM | POA: Diagnosis not present

## 2017-01-16 ENCOUNTER — Ambulatory Visit (INDEPENDENT_AMBULATORY_CARE_PROVIDER_SITE_OTHER): Payer: PPO

## 2017-01-16 ENCOUNTER — Ambulatory Visit (INDEPENDENT_AMBULATORY_CARE_PROVIDER_SITE_OTHER): Payer: PPO | Admitting: Orthopedic Surgery

## 2017-01-16 ENCOUNTER — Encounter (INDEPENDENT_AMBULATORY_CARE_PROVIDER_SITE_OTHER): Payer: Self-pay | Admitting: Orthopedic Surgery

## 2017-01-16 DIAGNOSIS — M1711 Unilateral primary osteoarthritis, right knee: Secondary | ICD-10-CM

## 2017-01-16 DIAGNOSIS — M1712 Unilateral primary osteoarthritis, left knee: Secondary | ICD-10-CM

## 2017-01-16 NOTE — Progress Notes (Signed)
Office Visit Note   Patient: Natalie Good           Date of Birth: 07/03/1951           MRN: [id number] Visit Date: 01/16/2017 Requested by: Romero BellingSean Ellison, MD [address] [place], [place] [address] PCP: Romero BellingELLISON, SEAN, MD  Subjective: Chief Complaint  Patient presents with  . Right Knee - Pain  . Left Knee - Pain    HPI: Natalie Good is a 66 year old patient with bilateral knee pain.  She reports constant knee pain which is getting worse.  She describes swelling and occasional giving way.  His hardware to bend the knee in its are for her to get up from a seated position.  She is here with her husband today who is also having knee issues.  She does not use any assistive devices.  Pain does not wake her from sleep at night.  She does not really want cortisone injections.  She is a vegetarian.  She has had no episodes of patellar dislocation.              ROS: All systems reviewed are negative as they relate to the chief complaint within the history of present illness.  Patient denies  fevers or chills.   Assessment & Plan: Visit Diagnoses:  1. Unilateral primary osteoarthritis, left knee   2. Unilateral primary osteoarthritis, right knee     Plan: Impression is severe bilateral patellofemoral arthritis with no real malalignment issues on visual inspection but with some lateral subluxation of the patella was on the merchant view.  She's having generally global knee pain.  I think eventually she might need knee replacement.  Today I want to start the process to pre-approve her for Synvisc.  This is worth a try before considering knee replacement surgery.  I'll see her back once that is approved and we can inject both knees.  Follow-Up Instructions: No Follow-up on file.   Orders:  Orders Placed This Encounter  Procedures  . XR KNEE 3 VIEW RIGHT  . XR KNEE 3 VIEW LEFT   No orders of the defined types were placed in this encounter.     Procedures: No procedures  performed   Clinical Data: No additional findings.  Objective: Vital Signs: There were no vitals taken for this visit.  Physical Exam:   Constitutional: Patient appears well-developed HEENT:  Head: Normocephalic Eyes:EOM are normal Neck: Normal range of motion Cardiovascular: Normal rate Pulmonary/chest: Effort normal Neurologic: Patient is alert Skin: Skin is warm Psychiatric: Patient has normal mood and affect    Ortho Exam: Orthopedic exam demonstrates normal gait alignment significant patellofemoral crepitus bilaterally but with no increased Q angle and negative patellar apprehension.  Collateral and cruciate ligaments are stable bilaterally.  There is no groin pain with internal/external rotation of either leg.  There is no nerve retention signs.  Collateral and cruciate ligaments are stable.  No real focal medial or lateral joint line tenderness.  Extensor mechanism is intact.  No other masses lymph adenopathy or skin changes noted in bilateral knee region.  Coronal alignment normal.  Specialty Comments:  No specialty comments available.  Imaging: Xr Knee 3 View Left  Result Date: 01/16/2017 AP lateral merchant view left knee demonstrates significant patellofemoral arthritis with bone-on-bone changes noted on the merchant view.  Medial and lateral joint spaces appear to be relatively spared.  Bone appears mildly osteopenic.  Patellar height is normal.  There is no effusion.  Xr  Knee 3 View Right  Result Date: 01/16/2017 AP lateral merchant right knee reviewed.  Significant patellofemoral arthritis is present with bone-on-bone changes and mild lateral subluxation noted.  Medial and lateral compartments appear relatively spared.  No effusion is present.  Bones appear osteopenic mildly    PMFS History: Patient Active Problem List   Diagnosis Date Noted  . Encounter for long-term (current) use of other medications 10/15/2011  . Menopause 10/15/2011  . Neck pain 10/15/2011   . Dry eye syndrome 10/15/2011  . ACUTE CYSTITIS 03/17/2008  . HYPERTENSION 11/27/2007  . KNEE PAIN, RIGHT 11/27/2007  . ASYMPTOMATIC POSTMENOPAUSAL STATUS 11/27/2007  . ANEMIA-NOS 05/15/2007  . ALLERGIC RHINITIS 05/15/2007   Past Medical History:  Diagnosis Date  . ALLERGIC RHINITIS   . ANEMIA-NOS   . Arthritis   . GERD (gastroesophageal reflux disease)   . History of chicken pox   . HYPERTENSION     Family History  Problem Relation Age of Onset  . Cancer Mother     Breast Cancer  . Arthritis Other     Parent  . Hyperlipidemia Other     Parents  . Stroke Other     Parent  . Hypertension Other     Parent    No past surgical history on file. Social History   Occupational History  . Unemployed    Social History Main Topics  . Smoking status: Never Smoker  . Smokeless tobacco: Never Used  . Alcohol use No  . Drug use: No  . Sexual activity: Not on file

## 2017-01-20 ENCOUNTER — Encounter (INDEPENDENT_AMBULATORY_CARE_PROVIDER_SITE_OTHER): Payer: Self-pay | Admitting: Orthopedic Surgery

## 2017-01-20 ENCOUNTER — Ambulatory Visit (INDEPENDENT_AMBULATORY_CARE_PROVIDER_SITE_OTHER): Payer: PPO | Admitting: Orthopedic Surgery

## 2017-01-20 DIAGNOSIS — M1712 Unilateral primary osteoarthritis, left knee: Secondary | ICD-10-CM | POA: Diagnosis not present

## 2017-01-20 DIAGNOSIS — M1711 Unilateral primary osteoarthritis, right knee: Secondary | ICD-10-CM

## 2017-01-20 MED ORDER — LIDOCAINE HCL 1 % IJ SOLN
5.0000 mL | INTRAMUSCULAR | Status: AC | PRN
  Administered 2017-01-20: 5 mL

## 2017-01-20 MED ORDER — HYLAN G-F 20 48 MG/6ML IX SOSY
48.0000 mg | PREFILLED_SYRINGE | INTRA_ARTICULAR | Status: AC | PRN
  Administered 2017-01-20: 48 mg via INTRA_ARTICULAR

## 2017-01-20 NOTE — Progress Notes (Signed)
   Procedure Note  Patient: Natalie Good             Date of Birth: 02/07/1951           MRN: [id number]             Visit Date: 01/20/2017  Procedures: Visit Diagnoses: Unilateral primary osteoarthritis, left knee  Unilateral primary osteoarthritis, right knee  Large Joint Inj Date/Time: 01/20/2017 11:45 AM Performed by: Cammy CopaEAN, SCOTT GREGORY Authorized by: Cammy CopaEAN, SCOTT GREGORY   Consent Given by:  Patient Site marked: the procedure site was marked   Timeout: prior to procedure the correct patient, procedure, and site was verified   Indications:  Pain, joint swelling and diagnostic evaluation Location:  Knee Site:  L knee Prep: patient was prepped and draped in usual sterile fashion   Needle Size:  18 G Needle Length:  1.5 inches Approach:  Superolateral Ultrasound Guidance: No   Fluoroscopic Guidance: No   Arthrogram: No   Medications:  48 mg Hylan 48 MG/6ML; 5 mL lidocaine 1 % Patient tolerance:  Patient tolerated the procedure well with no immediate complications

## 2017-05-05 DIAGNOSIS — H2511 Age-related nuclear cataract, right eye: Secondary | ICD-10-CM | POA: Diagnosis not present

## 2017-07-03 IMAGING — MG MM DIGITAL SCREENING BILAT W/ TOMO W/ CAD
9 of 12 series · 9 of 28 positions shown · non-contrast
Comparison: Previous exam(s).

CLINICAL DATA: Screening.

EXAM:
2D DIGITAL SCREENING BILATERAL MAMMOGRAM WITH CAD AND ADJUNCT TOMO

[L MLO]
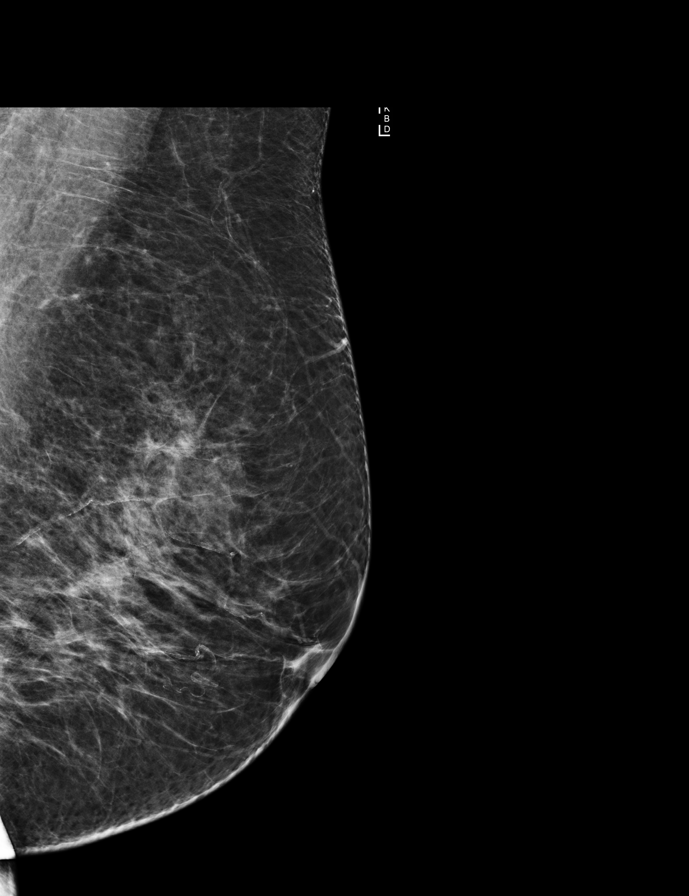

[R MLO]
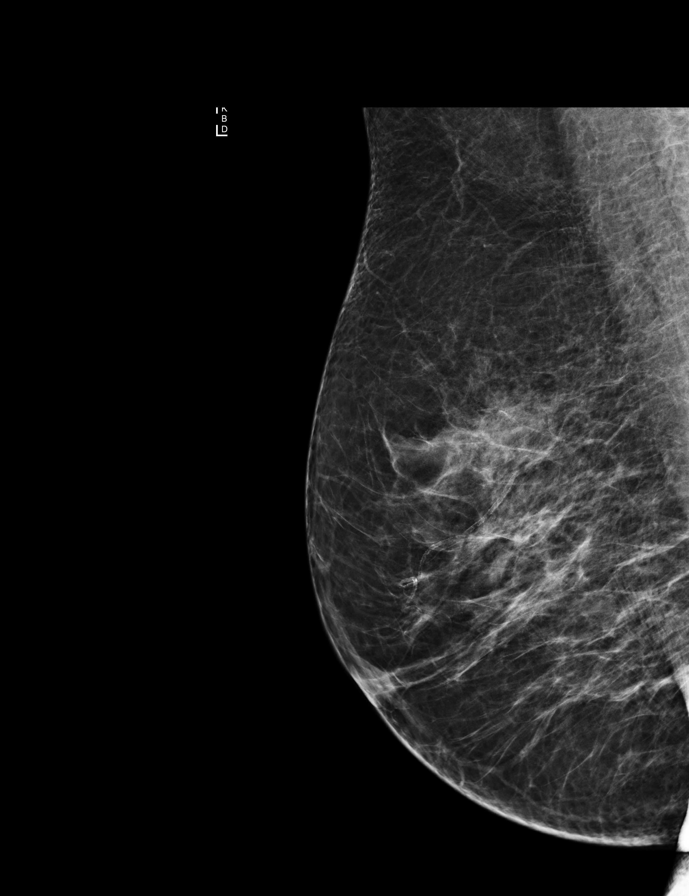

[L CC synth-2D]
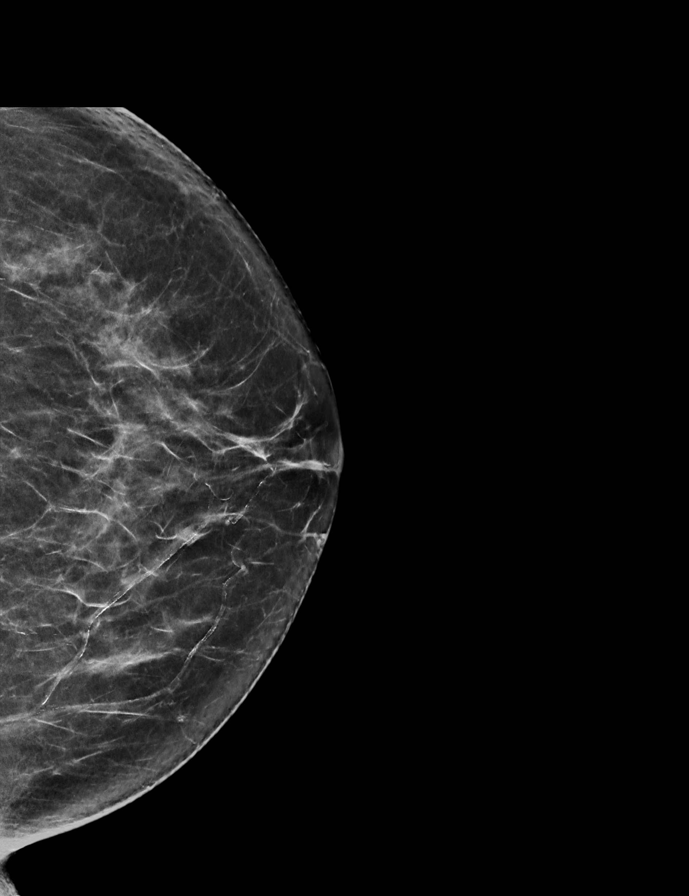

[L CC]
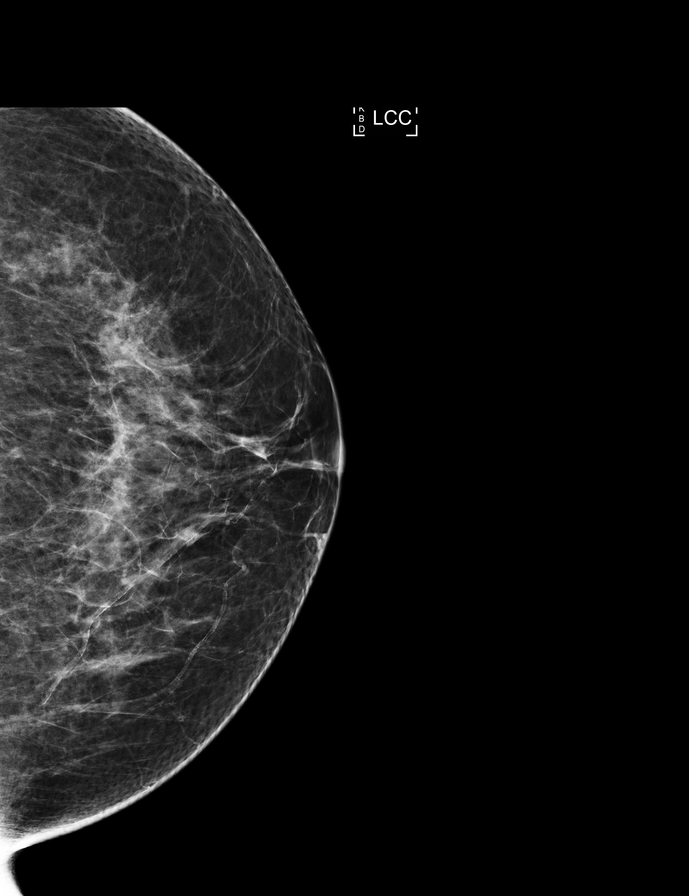

[R CC]
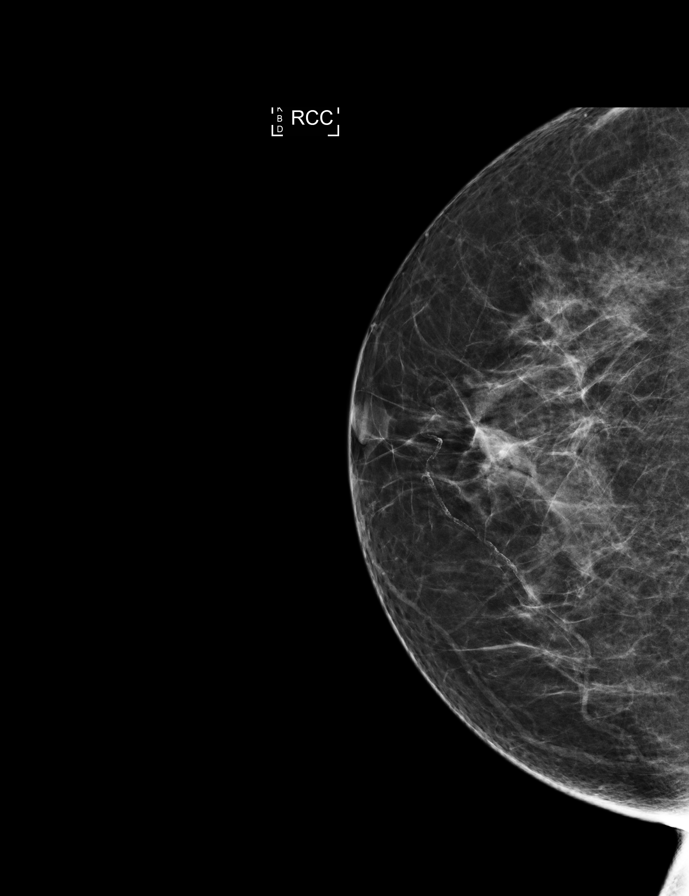

[R CC synth-2D]
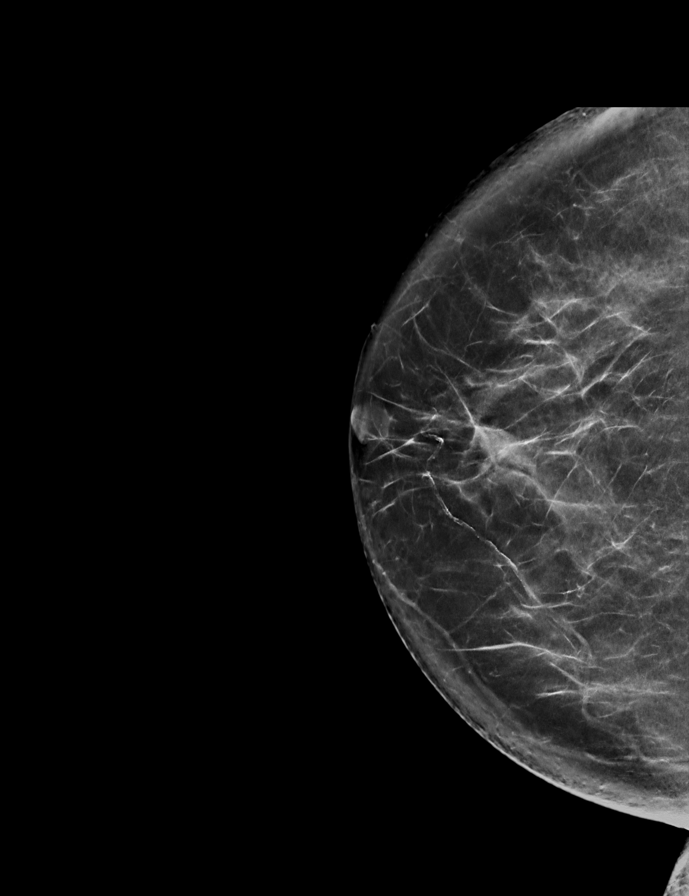

[R MLO synth-2D]
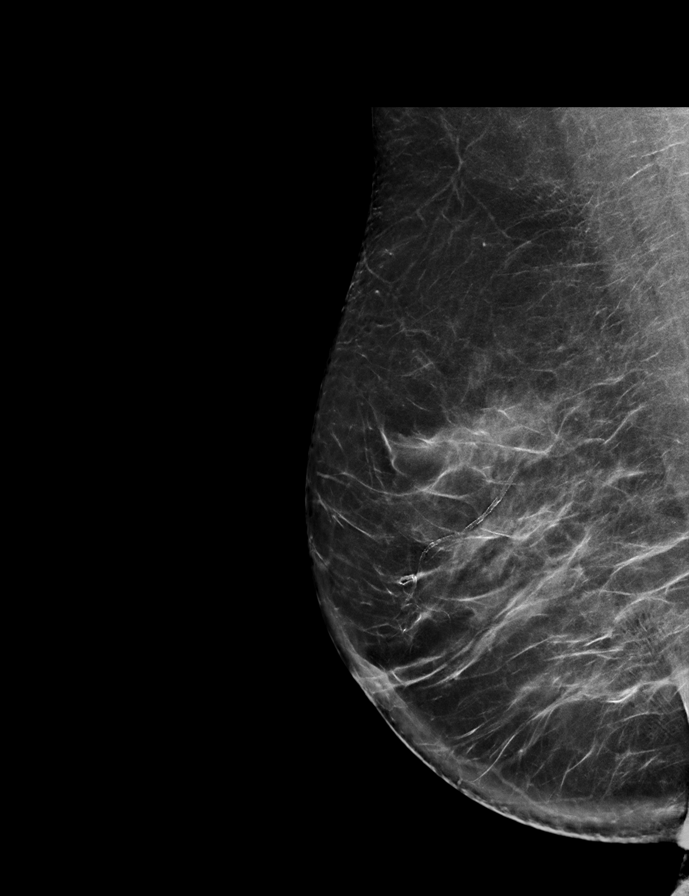

[L MLO synth-2D]
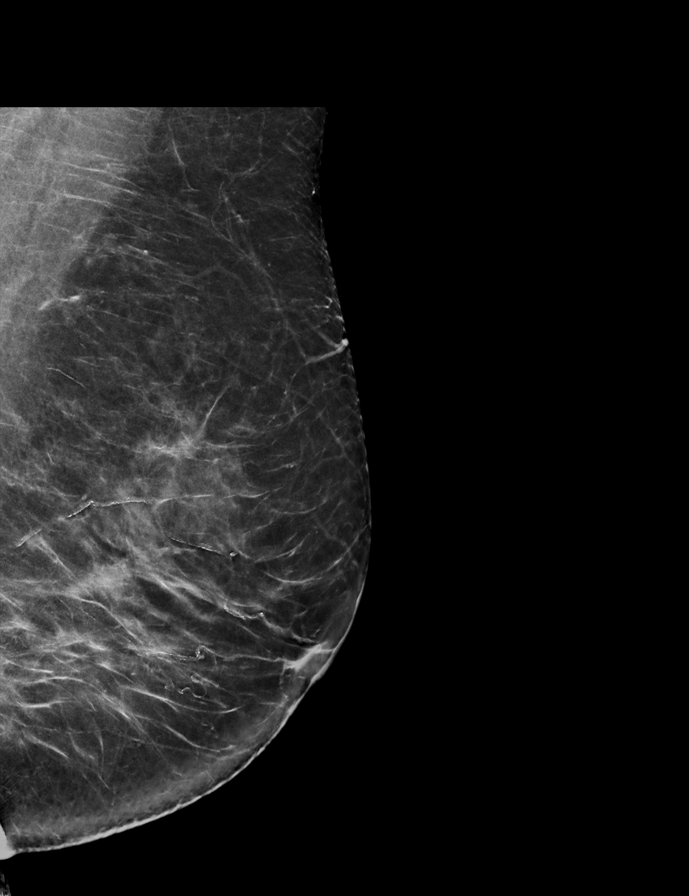

[L MLO tomo · tomo slice 39/76.0]
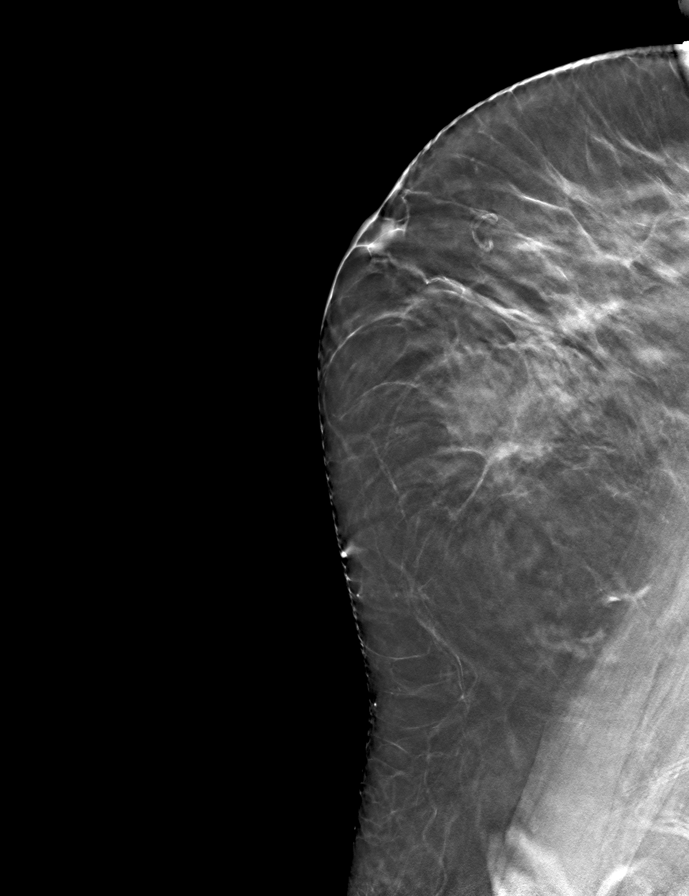

[9 of 28 positions shown; findings below may reference images not displayed]

ACR Breast Density Category b: There are scattered areas of
fibroglandular density.
FINDINGS: There are no findings suspicious for malignancy. Images were
processed with CAD.
IMPRESSION: No mammographic evidence of malignancy. A result letter of this
screening mammogram will be mailed directly to the patient.

RECOMMENDATION:
Screening mammogram in one year. (Code:97-6-RS4)

BI-RADS CATEGORY  1: Negative.

## 2017-07-04 DIAGNOSIS — I10 Essential (primary) hypertension: Secondary | ICD-10-CM | POA: Diagnosis not present

## 2017-07-04 DIAGNOSIS — R7309 Other abnormal glucose: Secondary | ICD-10-CM | POA: Diagnosis not present

## 2017-07-04 DIAGNOSIS — R7989 Other specified abnormal findings of blood chemistry: Secondary | ICD-10-CM | POA: Diagnosis not present

## 2017-07-04 DIAGNOSIS — K219 Gastro-esophageal reflux disease without esophagitis: Secondary | ICD-10-CM | POA: Diagnosis not present

## 2017-07-04 DIAGNOSIS — M170 Bilateral primary osteoarthritis of knee: Secondary | ICD-10-CM | POA: Diagnosis not present

## 2017-07-04 DIAGNOSIS — Z0000 Encounter for general adult medical examination without abnormal findings: Secondary | ICD-10-CM | POA: Diagnosis not present

## 2017-07-16 DIAGNOSIS — K219 Gastro-esophageal reflux disease without esophagitis: Secondary | ICD-10-CM | POA: Diagnosis not present

## 2017-07-16 DIAGNOSIS — H543 Unqualified visual loss, both eyes: Secondary | ICD-10-CM | POA: Diagnosis not present

## 2017-07-16 DIAGNOSIS — M170 Bilateral primary osteoarthritis of knee: Secondary | ICD-10-CM | POA: Diagnosis not present

## 2017-07-16 DIAGNOSIS — I10 Essential (primary) hypertension: Secondary | ICD-10-CM | POA: Diagnosis not present

## 2017-08-05 ENCOUNTER — Telehealth: Payer: Self-pay | Admitting: Emergency Medicine

## 2017-12-03 ENCOUNTER — Telehealth (INDEPENDENT_AMBULATORY_CARE_PROVIDER_SITE_OTHER): Payer: Self-pay

## 2017-12-03 NOTE — Telephone Encounter (Signed)
Patient daughter would like to know if patient can get a gel injection? Cb# is [phone].  Please advise.  Thank you

## 2017-12-03 NOTE — Telephone Encounter (Signed)
Yes would be fine. Last monovisc done 01/2017.

## 2017-12-04 ENCOUNTER — Telehealth (INDEPENDENT_AMBULATORY_CARE_PROVIDER_SITE_OTHER): Payer: Self-pay

## 2017-12-04 NOTE — Telephone Encounter (Signed)
Called and left VM for patient's daughter to call back to schedule appt. For SynviscOne injection, bilateral knee.

## 2017-12-04 NOTE — Telephone Encounter (Signed)
Submitted application online for SynviscOne, bilateral knee. 

## 2017-12-04 NOTE — Telephone Encounter (Signed)
Noted  

## 2018-02-20 DIAGNOSIS — L438 Other lichen planus: Secondary | ICD-10-CM | POA: Diagnosis not present

## 2018-03-13 ENCOUNTER — Telehealth (INDEPENDENT_AMBULATORY_CARE_PROVIDER_SITE_OTHER): Payer: Self-pay

## 2018-03-13 NOTE — Telephone Encounter (Signed)
Bilateral synvisc

## 2018-03-18 ENCOUNTER — Telehealth (INDEPENDENT_AMBULATORY_CARE_PROVIDER_SITE_OTHER): Payer: Self-pay

## 2018-03-18 NOTE — Telephone Encounter (Signed)
Talked with patient's daughter and advised her that patient is approved to have SynviscOne, bilateral knee. Covered at 80% and will be responsible for 20% OOP. [employer] Bill [employer]-pay $20.00 No PA required Appt.scheduled 03/30/2018

## 2018-03-18 NOTE — Telephone Encounter (Signed)
Patient had been approved to have SynviscOne injection, bilateral knee.  Appointment scheduled

## 2018-03-30 ENCOUNTER — Ambulatory Visit (INDEPENDENT_AMBULATORY_CARE_PROVIDER_SITE_OTHER): Payer: PPO | Admitting: Orthopedic Surgery

## 2018-04-10 DIAGNOSIS — H1851 Endothelial corneal dystrophy: Secondary | ICD-10-CM | POA: Diagnosis not present

## 2018-04-10 DIAGNOSIS — H2511 Age-related nuclear cataract, right eye: Secondary | ICD-10-CM | POA: Diagnosis not present

## 2018-04-14 DIAGNOSIS — E559 Vitamin D deficiency, unspecified: Secondary | ICD-10-CM | POA: Diagnosis not present

## 2018-04-22 DIAGNOSIS — H543 Unqualified visual loss, both eyes: Secondary | ICD-10-CM | POA: Diagnosis not present

## 2018-04-22 DIAGNOSIS — I10 Essential (primary) hypertension: Secondary | ICD-10-CM | POA: Diagnosis not present

## 2018-04-22 DIAGNOSIS — K219 Gastro-esophageal reflux disease without esophagitis: Secondary | ICD-10-CM | POA: Diagnosis not present

## 2018-04-22 DIAGNOSIS — R7309 Other abnormal glucose: Secondary | ICD-10-CM | POA: Diagnosis not present

## 2018-04-22 DIAGNOSIS — M170 Bilateral primary osteoarthritis of knee: Secondary | ICD-10-CM | POA: Diagnosis not present

## 2018-06-01 ENCOUNTER — Ambulatory Visit (INDEPENDENT_AMBULATORY_CARE_PROVIDER_SITE_OTHER): Payer: PPO | Admitting: Orthopedic Surgery

## 2018-06-03 ENCOUNTER — Encounter (INDEPENDENT_AMBULATORY_CARE_PROVIDER_SITE_OTHER): Payer: Self-pay | Admitting: Orthopedic Surgery

## 2018-06-03 ENCOUNTER — Ambulatory Visit (INDEPENDENT_AMBULATORY_CARE_PROVIDER_SITE_OTHER): Payer: PPO | Admitting: Orthopedic Surgery

## 2018-06-03 DIAGNOSIS — M1711 Unilateral primary osteoarthritis, right knee: Secondary | ICD-10-CM | POA: Diagnosis not present

## 2018-06-03 DIAGNOSIS — M1712 Unilateral primary osteoarthritis, left knee: Secondary | ICD-10-CM | POA: Diagnosis not present

## 2018-06-03 MED ORDER — HYLAN G-F 20 48 MG/6ML IX SOSY
48.0000 mg | PREFILLED_SYRINGE | INTRA_ARTICULAR | Status: AC | PRN
  Administered 2018-06-03: 48 mg via INTRA_ARTICULAR

## 2018-06-03 MED ORDER — LIDOCAINE HCL 1 % IJ SOLN
5.0000 mL | INTRAMUSCULAR | Status: AC | PRN
  Administered 2018-06-03: 5 mL

## 2018-06-03 NOTE — Progress Notes (Signed)
   Procedure Note  Patient: Kathi SimpersLata M Focht             Date of Birth: 10/03/1950           MRN: [id number]             Visit Date: 06/03/2018  Procedures: Visit Diagnoses: Unilateral primary osteoarthritis, left knee  Unilateral primary osteoarthritis, right knee  Large Joint Inj: bilateral knee on 06/03/2018 9:58 PM Indications: pain, joint swelling and diagnostic evaluation Details: 18 G 1.5 in needle, superolateral approach  Arthrogram: No  Medications (Right): 5 mL lidocaine 1 %; 48 mg Hylan 48 MG/6ML Medications (Left): 5 mL lidocaine 1 %; 48 mg Hylan 48 MG/6ML Outcome: tolerated well, no immediate complications Procedure, treatment alternatives, risks and benefits explained, specific risks discussed. Consent was given by the patient. Immediately prior to procedure a time out was called to verify the correct patient, procedure, equipment, support staff and site/side marked as required. Patient was prepped and draped in the usual sterile fashion.

## 2018-12-01 HISTORY — PX: CORNEAL TRANSPLANT: SHX108

## 2019-01-11 ENCOUNTER — Telehealth (INDEPENDENT_AMBULATORY_CARE_PROVIDER_SITE_OTHER): Payer: Self-pay | Admitting: Orthopedic Surgery

## 2019-01-11 NOTE — Telephone Encounter (Signed)
Patient request approval auth for gel injection

## 2019-01-11 NOTE — Telephone Encounter (Signed)
Can you submit for auth please?

## 2019-01-13 ENCOUNTER — Telehealth (INDEPENDENT_AMBULATORY_CARE_PROVIDER_SITE_OTHER): Payer: Self-pay

## 2019-01-13 ENCOUNTER — Telehealth: Payer: Self-pay | Admitting: Orthopedic Surgery

## 2019-01-13 NOTE — Telephone Encounter (Signed)
Received call from [place] with [employer] program called advised she called [employer] and was told the ID number given is not one of their members. The ID number is [id number]. The number to contact Moshe Cipro is [phone]   EXT: 1622

## 2019-01-13 NOTE — Telephone Encounter (Signed)
Submitted VOB for Monovisc, bilateral knee. 

## 2019-01-13 NOTE — Telephone Encounter (Signed)
Noted  

## 2019-01-14 NOTE — Telephone Encounter (Signed)
Talked with Moshe Cipro at [employer] and advised her that patient has [employer] and not [employer].  Provided Britt with phone number for [employer].

## 2019-01-15 ENCOUNTER — Telehealth (INDEPENDENT_AMBULATORY_CARE_PROVIDER_SITE_OTHER): Payer: Self-pay

## 2019-01-15 NOTE — Telephone Encounter (Signed)
Per VOB, Monovisc is not covered due to policy being termed on 09/16/2018.  Talked with patient's daughter and was advised that patient has [facility] and that she will call back with the Member ID#.

## 2019-02-03 ENCOUNTER — Telehealth: Payer: Self-pay

## 2019-02-03 NOTE — Telephone Encounter (Signed)
Submitted VOB for Gel-One, bilateral knee.  

## 2019-02-03 NOTE — Telephone Encounter (Signed)
Pt daughter called to check on gel injection.

## 2019-02-03 NOTE — Telephone Encounter (Signed)
Talked with patient's daughter and she provided me with patient's insurance information.  Patient has [employer].  Submitted for Gel-One, bilateral knee.

## 2019-02-05 ENCOUNTER — Telehealth: Payer: Self-pay

## 2019-02-05 NOTE — Telephone Encounter (Signed)
Patient is Approved for Gel-One, bilateral knee. Buy & Bill Patient will be responsible for 20% OOP. No Co-pay PA required PA Approval#[id number] Valid 02/04/2019- 05/07/2019  Appt.02/12/2019 with Dr. August Saucer

## 2019-02-10 ENCOUNTER — Other Ambulatory Visit: Payer: Self-pay | Admitting: Internal Medicine

## 2019-02-10 DIAGNOSIS — Z1231 Encounter for screening mammogram for malignant neoplasm of breast: Secondary | ICD-10-CM

## 2019-02-11 ENCOUNTER — Telehealth: Payer: Self-pay | Admitting: Radiology

## 2019-02-11 NOTE — Telephone Encounter (Signed)
Patients daughter said ok to cancel and will call back to reschedule.

## 2019-02-12 ENCOUNTER — Ambulatory Visit: Payer: PPO | Admitting: Orthopedic Surgery

## 2019-08-05 ENCOUNTER — Other Ambulatory Visit: Admission: RE | Admit: 2019-08-05 | Payer: Medicare HMO | Source: Ambulatory Visit

## 2019-08-06 ENCOUNTER — Other Ambulatory Visit
Admission: RE | Admit: 2019-08-06 | Discharge: 2019-08-06 | Disposition: A | Discharge status: Home / Self Care | Payer: Medicare HMO | Source: Ambulatory Visit | Attending: Internal Medicine | Admitting: Internal Medicine

## 2019-08-06 ENCOUNTER — Other Ambulatory Visit: Payer: Self-pay

## 2019-08-06 DIAGNOSIS — Z01812 Encounter for preprocedural laboratory examination: Secondary | ICD-10-CM | POA: Insufficient documentation

## 2019-08-06 DIAGNOSIS — Z20828 Contact with and (suspected) exposure to other viral communicable diseases: Secondary | ICD-10-CM | POA: Diagnosis not present

## 2019-08-06 LAB — SARS CORONAVIRUS 2 (TAT 6-24 HRS): SARS Coronavirus 2: NEGATIVE

## 2019-08-09 ENCOUNTER — Ambulatory Visit: Payer: Medicare HMO | Admitting: Certified Registered Nurse Anesthetist

## 2019-08-09 ENCOUNTER — Other Ambulatory Visit: Payer: Self-pay

## 2019-08-09 ENCOUNTER — Encounter: Payer: Self-pay | Admitting: Certified Registered Nurse Anesthetist

## 2019-08-09 ENCOUNTER — Ambulatory Visit
Admission: RE | Admit: 2019-08-09 | Discharge: 2019-08-09 | Disposition: A | Discharge status: Home / Self Care | Payer: Medicare HMO | Attending: Internal Medicine | Admitting: Internal Medicine

## 2019-08-09 ENCOUNTER — Encounter
Admission: RE | Disposition: A | Discharge status: Home / Self Care | Payer: Self-pay | Source: Home / Self Care | Attending: Internal Medicine

## 2019-08-09 DIAGNOSIS — Z79899 Other long term (current) drug therapy: Secondary | ICD-10-CM | POA: Insufficient documentation

## 2019-08-09 DIAGNOSIS — K222 Esophageal obstruction: Secondary | ICD-10-CM | POA: Insufficient documentation

## 2019-08-09 DIAGNOSIS — I10 Essential (primary) hypertension: Secondary | ICD-10-CM | POA: Diagnosis not present

## 2019-08-09 DIAGNOSIS — Z888 Allergy status to other drugs, medicaments and biological substances status: Secondary | ICD-10-CM | POA: Insufficient documentation

## 2019-08-09 DIAGNOSIS — K449 Diaphragmatic hernia without obstruction or gangrene: Secondary | ICD-10-CM | POA: Diagnosis not present

## 2019-08-09 DIAGNOSIS — K5730 Diverticulosis of large intestine without perforation or abscess without bleeding: Secondary | ICD-10-CM | POA: Diagnosis not present

## 2019-08-09 DIAGNOSIS — K219 Gastro-esophageal reflux disease without esophagitis: Secondary | ICD-10-CM | POA: Insufficient documentation

## 2019-08-09 DIAGNOSIS — M1711 Unilateral primary osteoarthritis, right knee: Secondary | ICD-10-CM | POA: Insufficient documentation

## 2019-08-09 DIAGNOSIS — K228 Other specified diseases of esophagus: Secondary | ICD-10-CM | POA: Diagnosis not present

## 2019-08-09 DIAGNOSIS — Z1211 Encounter for screening for malignant neoplasm of colon: Secondary | ICD-10-CM | POA: Insufficient documentation

## 2019-08-09 DIAGNOSIS — R1310 Dysphagia, unspecified: Secondary | ICD-10-CM | POA: Diagnosis present

## 2019-08-09 DIAGNOSIS — Z886 Allergy status to analgesic agent status: Secondary | ICD-10-CM | POA: Diagnosis not present

## 2019-08-09 HISTORY — PX: COLONOSCOPY WITH PROPOFOL: SHX5780

## 2019-08-09 HISTORY — DX: Endothelial corneal dystrophy, unspecified eye: H18.519

## 2019-08-09 HISTORY — PX: ESOPHAGOGASTRODUODENOSCOPY (EGD) WITH PROPOFOL: SHX5813

## 2019-08-09 SURGERY — ESOPHAGOGASTRODUODENOSCOPY (EGD) WITH PROPOFOL
Anesthesia: General

## 2019-08-09 MED ORDER — LIDOCAINE HCL (CARDIAC) PF 100 MG/5ML IV SOSY
PREFILLED_SYRINGE | INTRAVENOUS | Status: DC | PRN
  Administered 2019-08-09: 50 mg via INTRAVENOUS

## 2019-08-09 MED ORDER — SODIUM CHLORIDE 0.9 % IV SOLN
INTRAVENOUS | Status: DC
  Administered 2019-08-09: 10:00:00 via INTRAVENOUS

## 2019-08-09 MED ORDER — LIDOCAINE HCL (PF) 2 % IJ SOLN
INTRAMUSCULAR | Status: AC
  Filled 2019-08-09: qty 10

## 2019-08-09 MED ORDER — PROPOFOL 10 MG/ML IV BOLUS
INTRAVENOUS | Status: DC | PRN
  Administered 2019-08-09: 20 mg via INTRAVENOUS
  Administered 2019-08-09: 80 mg via INTRAVENOUS
  Administered 2019-08-09: 20 mg via INTRAVENOUS

## 2019-08-09 MED ORDER — PROPOFOL 500 MG/50ML IV EMUL
INTRAVENOUS | Status: DC | PRN
  Administered 2019-08-09: 130 ug/kg/min via INTRAVENOUS

## 2019-08-09 MED ORDER — PROPOFOL 500 MG/50ML IV EMUL
INTRAVENOUS | Status: AC
  Filled 2019-08-09: qty 50

## 2019-08-09 NOTE — Transfer of Care (Signed)
Immediate Anesthesia Transfer of Care Note  Patient: Natalie Good  Procedure(s) Performed: ESOPHAGOGASTRODUODENOSCOPY (EGD) WITH PROPOFOL (N/A ) COLONOSCOPY WITH PROPOFOL (N/A )  Patient Location: PACU and Endoscopy Unit  Anesthesia Type:General  Level of Consciousness: drowsy  Airway & Oxygen Therapy: Patient Spontanous Breathing  Post-op Assessment: Report given to RN and Post -op Vital signs reviewed and stable  Post vital signs: Reviewed and stable  Last Vitals:  Vitals Value Taken Time  BP 99/66 08/09/19 1026  Temp 35.8 C 08/09/19 1026  Pulse 74 08/09/19 1026  Resp 15 08/09/19 1026  SpO2 99 % 08/09/19 1026  Vitals shown include unvalidated device data.  Last Pain:  Vitals:   08/09/19 1026  TempSrc: Tympanic  PainSc: 0-No pain         Complications: No apparent anesthesia complications

## 2019-08-09 NOTE — H&P (Signed)
Outpatient short stay form Pre-procedure 08/09/2019 9:57 AM Rilyn Scroggs K. Alice Reichert, M.D.  Primary Physician: Tracie Harrier, M.D.  Reason for visit:  Dysphagia, Colon cancer screening  History of present illness:  As above. Patient denies change in bowel habits, rectal bleeding, weight loss or abdominal pain.      Current Facility-Administered Medications:  .  0.9 %  sodium chloride infusion, , Intravenous, Continuous, Boody, Benay Pike, MD, Last Rate: 20 mL/hr at 08/09/19 9563  Medications Prior to Admission  Medication Sig Dispense Refill Last Dose  . amLODipine (NORVASC) 5 MG tablet Take 5 mg by mouth daily.   08/09/2019 at Unknown time  . clotrimazole-betamethasone (LOTRISONE) cream Apply topically 2 (two) times daily. 30 g 2 08/08/2019 at Unknown time  . fexofenadine (ALLEGRA) 180 MG tablet Take 180 mg by mouth daily.   08/08/2019 at Unknown time  . lisinopril (PRINIVIL,ZESTRIL) 10 MG tablet TAKE 1 TABLET (10 MG TOTAL) BY MOUTH DAILY. 30 tablet 3 08/09/2019 at Unknown time  . omeprazole (PRILOSEC) 20 MG capsule Take 20 mg by mouth 2 (two) times daily as needed.   08/07/2019  . sodium chloride (MURO 128) 2 % ophthalmic solution Apply 1 drop to eye.   Past Week at Unknown time  . montelukast (SINGULAIR) 10 MG tablet Take 10 mg by mouth as needed.   Not Taking at Unknown time  . ranitidine (ZANTAC) 150 MG tablet Take 150 mg by mouth daily.   Not Taking at Unknown time  . traMADol (ULTRAM) 50 MG tablet Take 1 tablet (50 mg total) by mouth every 8 (eight) hours as needed for pain. 30 tablet 0 07/09/2019     Allergies  Allergen Reactions  . Lisinopril Swelling  . Nsaids     REACTION: Hives     Past Medical History:  Diagnosis Date  . ALLERGIC RHINITIS   . ANEMIA-NOS   . Arthritis    Osteo right knee  . Fuchs' endothelial dystrophy   . GERD (gastroesophageal reflux disease)   . History of chicken pox   . HYPERTENSION     Review of systems:  Otherwise negative.     Physical Exam  Gen: Alert, oriented. Appears stated age.  HEENT: [place]/AT. PERRLA. Lungs: CTA, no wheezes. CV: RR nl S1, S2. Abd: soft, benign, no masses. BS+ Ext: No edema. Pulses 2+    Planned procedures: Proceed with EGD and colonoscopy. The patient understands the nature of the planned procedure, indications, risks, alternatives and potential complications including but not limited to bleeding, infection, perforation, damage to internal organs and possible oversedation/side effects from anesthesia. The patient agrees and gives consent to proceed.  Please refer to procedure notes for findings, recommendations and patient disposition/instructions.     Leilanny Fluitt K. Alice Reichert, M.D. Gastroenterology 08/09/2019  9:57 AM

## 2019-08-09 NOTE — Interval H&P Note (Signed)
History and Physical Interval Note:  08/09/2019 9:59 AM  Steva Ready DALYA MASELLI  has presented today for surgery, with the diagnosis of gerd screening dysphagia.  The various methods of treatment have been discussed with the patient and family. After consideration of risks, benefits and other options for treatment, the patient has consented to  Procedure(s): ESOPHAGOGASTRODUODENOSCOPY (EGD) WITH PROPOFOL (N/A) COLONOSCOPY WITH PROPOFOL (N/A) as a surgical intervention.  The patient's history has been reviewed, patient examined, no change in status, stable for surgery.  I have reviewed the patient's chart and labs.  Questions were answered to the patient's satisfaction.     [place], [place]

## 2019-08-09 NOTE — Op Note (Signed)
[facility] Gastroenterology Patient Name: Natalie Good Procedure Date: 08/09/2019 9:50 AM MRN: [id number] Account #: [id number] Date of Birth: Apr 19, 1951 Admit Type: Outpatient Age: 68 Room: [facility] ENDO ROOM 3 Gender: Female Note Status: Finalized Procedure:             Upper GI endoscopy Indications:           Dysphagia, Esophageal reflux Providers:             Boykin Nearing. Norma Fredrickson MD, MD Referring MD:          Barbette Reichmann, MD (Referring MD) Medicines:             Propofol per Anesthesia Complications:         No immediate complications. Procedure:             Pre-Anesthesia Assessment:                        - The risks and benefits of the procedure and the                         sedation options and risks were discussed with the                         patient. All questions were answered and informed                         consent was obtained.                        - Patient identification and proposed procedure were                         verified prior to the procedure by the nurse. The                         procedure was verified in the procedure room.                        - ASA Grade Assessment: III - A patient with severe                         systemic disease.                        - After reviewing the risks and benefits, the patient                         was deemed in satisfactory condition to undergo the                         procedure.                        After obtaining informed consent, the endoscope was                         passed under direct vision. Throughout the procedure,                         the patient's blood pressure, pulse,  and oxygen                         saturations were monitored continuously. The Endoscope                         was introduced through the mouth, and advanced to the                         third part of duodenum. The upper GI endoscopy was                         accomplished without  difficulty. The patient tolerated                         the procedure well. Findings:      Mucosal changes including small-caliber esophagus and circumferential       folds were found in the entire esophagus. Esophageal findings were       graded using the Eosinophilic Esophagitis Endoscopic Reference Score       (EoE-EREFS) as: Edema Grade 0 Normal (distinct vascular markings), Rings       Grade 1 Mild (subtle circumferential ridges seen on esophageal       distension), Exudates Grade 0 None (no white lesions seen), Furrows       Grade 0 None (no vertical lines seen) and Stricture present. Biopsies       were obtained from the proximal and distal esophagus with cold forceps       for histology of suspected eosinophilic esophagitis.      One benign-appearing, intrinsic mild stenosis was found in the distal       esophagus. This stenosis measured 1.5 cm (inner diameter) x less than       one cm (in length). The stenosis was traversed. The scope was withdrawn.       Dilation was performed with a Maloney dilator with no resistance at 37       Fr.      A 1 cm hiatal hernia was present.      The examined duodenum was normal.      The exam was otherwise without abnormality. Impression:            - Esophageal mucosal changes suspicious for                         eosinophilic esophagitis. Biopsied.                        - Benign-appearing esophageal stenosis. Dilated.                        - 1 cm hiatal hernia.                        - Normal examined duodenum.                        - The examination was otherwise normal. Recommendation:        - Await pathology results from EGD, also performed                         today.                        -  Monitor results to esophageal dilation                        - Proceed with colonoscopy Procedure Code(s):     --- Professional ---                        [phone], Esophagogastroduodenoscopy, flexible,                         transoral; with  biopsy, single or multiple                        43450, Dilation of esophagus, by unguided sound or                         bougie, single or multiple passes Diagnosis Code(s):     --- Professional ---                        K21.9, Gastro-esophageal reflux disease without                         esophagitis                        R13.10, Dysphagia, unspecified                        K44.9, Diaphragmatic hernia without obstruction or                         gangrene                        K22.2, Esophageal obstruction                        K22.8, Other specified diseases of esophagus CPT copyright 2019 American Medical Association. All rights reserved. The codes documented in this report are preliminary and upon coder review may  be revised to meet current compliance requirements. Stanton Kidneyeodoro K Toledo MD, MD 08/09/2019 10:10:54 AM This report has been signed electronically. Number of Addenda: 0 Note Initiated On: 08/09/2019 9:50 AM Estimated Blood Loss:  Estimated blood loss: none.      [facility]

## 2019-08-09 NOTE — Op Note (Signed)
[facility] Gastroenterology Patient Name: Natalie Good Procedure Date: 08/09/2019 9:50 AM MRN: [id number] Account #: [id number] Date of Birth: 1951/06/20 Admit Type: Outpatient Age: 68 Room: [facility] ENDO ROOM 3 Gender: Female Note Status: Finalized Procedure:             Colonoscopy Indications:           Screening for colorectal malignant neoplasm Providers:             Benay Pike. Alice Reichert MD, MD Referring MD:          Tracie Harrier, MD (Referring MD) Medicines:             Propofol per Anesthesia Complications:         No immediate complications. Procedure:             Pre-Anesthesia Assessment:                        - The risks and benefits of the procedure and the                         sedation options and risks were discussed with the                         patient. All questions were answered and informed                         consent was obtained.                        - Patient identification and proposed procedure were                         verified prior to the procedure by the nurse. The                         procedure was verified in the procedure room.                        - ASA Grade Assessment: III - A patient with severe                         systemic disease.                        - After reviewing the risks and benefits, the patient                         was deemed in satisfactory condition to undergo the                         procedure.                        After obtaining informed consent, the colonoscope was                         passed under direct vision. Throughout the procedure,                         the patient's blood pressure, pulse,  and oxygen                         saturations were monitored continuously. The                         Colonoscope was introduced through the anus and                         advanced to the the cecum, identified by appendiceal                         orifice and ileocecal valve.  The colonoscopy was                         performed without difficulty. The patient tolerated                         the procedure well. The quality of the bowel                         preparation was good. The ileocecal valve, appendiceal                         orifice, and rectum were photographed. Findings:      The perianal and digital rectal examinations were normal. Pertinent       negatives include normal sphincter tone and no palpable rectal lesions.      Many small and large-mouthed diverticula were found in the left colon.      The exam was otherwise without abnormality on direct and retroflexion       views. Impression:            - Diverticulosis in the left colon.                        - The examination was otherwise normal on direct and                         retroflexion views.                        - No specimens collected. Recommendation:        - Await pathology results from EGD, also performed                         today.                        - Monitor results to esophageal dilation                        - Patient has a contact number available for                         emergencies. The signs and symptoms of potential                         delayed complications were discussed with the patient.  Return to normal activities tomorrow. Written                         discharge instructions were provided to the patient.                        - Resume previous diet.                        - Continue present medications.                        - Repeat colonoscopy in 10 years for screening                         purposes.                        - Return to physician assistant in 6 weeks.                        - The findings and recommendations were discussed with                         the patient. Procedure Code(s):     --- Professional ---                        Z6109, Colorectal cancer screening; colonoscopy on                          individual not meeting criteria for high risk Diagnosis Code(s):     --- Professional ---                        K57.30, Diverticulosis of large intestine without                         perforation or abscess without bleeding                        Z12.11, Encounter for screening for malignant neoplasm                         of colon CPT copyright 2019 American Medical Association. All rights reserved. The codes documented in this report are preliminary and upon coder review may  be revised to meet current compliance requirements. Stanton Kidney MD, MD 08/09/2019 10:28:36 AM This report has been signed electronically. Number of Addenda: 0 Note Initiated On: 08/09/2019 9:50 AM Scope Withdrawal Time: 0 hours 5 minutes 44 seconds  Total Procedure Duration: 0 hours 9 minutes 29 seconds  Estimated Blood Loss:  Estimated blood loss: none.      [facility]

## 2019-08-09 NOTE — Anesthesia Post-op Follow-up Note (Signed)
Anesthesia QCDR form completed.        

## 2019-08-09 NOTE — Anesthesia Preprocedure Evaluation (Signed)
Anesthesia Evaluation  Patient identified by MRN, date of birth, ID band Patient awake    Reviewed: Allergy & Precautions, NPO status , Patient's Chart, lab work & pertinent test results  History of Anesthesia Complications Negative for: history of anesthetic complications  Airway Mallampati: II  TM Distance: >3 FB Neck ROM: Full    Dental no notable dental hx.    Pulmonary neg pulmonary ROS, neg sleep apnea, neg COPD,    breath sounds clear to auscultation- rhonchi (-) wheezing      Cardiovascular hypertension, Pt. on medications (-) CAD, (-) Past MI, (-) Cardiac Stents and (-) CABG  Rhythm:Regular Rate:Normal - Systolic murmurs and - Diastolic murmurs    Neuro/Psych neg Seizures negative neurological ROS  negative psych ROS   GI/Hepatic Neg liver ROS, GERD  ,  Endo/Other  negative endocrine ROSneg diabetes  Renal/GU negative Renal ROS     Musculoskeletal  (+) Arthritis ,   Abdominal (+) - obese,   Peds  Hematology  (+) anemia ,   Anesthesia Other Findings Past Medical History: No date: ALLERGIC RHINITIS No date: ANEMIA-NOS No date: Arthritis     Comment:  Osteo right knee No date: Fuchs' endothelial dystrophy No date: GERD (gastroesophageal reflux disease) No date: History of chicken pox No date: HYPERTENSION   Reproductive/Obstetrics                             Anesthesia Physical Anesthesia Plan  ASA: II  Anesthesia Plan: General   Post-op Pain Management:    Induction: Intravenous  PONV Risk Score and Plan: 2 and Propofol infusion  Airway Management Planned: Natural Airway  Additional Equipment:   Intra-op Plan:   Post-operative Plan:   Informed Consent: I have reviewed the patients History and Physical, chart, labs and discussed the procedure including the risks, benefits and alternatives for the proposed anesthesia with the patient or authorized representative  who has indicated his/her understanding and acceptance.     Dental advisory given  Plan Discussed with: CRNA and Anesthesiologist  Anesthesia Plan Comments:         Anesthesia Quick Evaluation

## 2019-08-10 ENCOUNTER — Encounter: Payer: Self-pay | Admitting: Internal Medicine

## 2019-08-10 LAB — SURGICAL PATHOLOGY

## 2019-08-10 NOTE — Anesthesia Postprocedure Evaluation (Signed)
Anesthesia Post Note  Patient: EVELISE REINE  Procedure(s) Performed: ESOPHAGOGASTRODUODENOSCOPY (EGD) WITH PROPOFOL (N/A ) COLONOSCOPY WITH PROPOFOL (N/A )  Patient location during evaluation: Endoscopy Anesthesia Type: General Level of consciousness: awake and alert and oriented Pain management: pain level controlled Vital Signs Assessment: post-procedure vital signs reviewed and stable Respiratory status: spontaneous breathing, nonlabored ventilation and respiratory function stable Cardiovascular status: blood pressure returned to baseline and stable Postop Assessment: no signs of nausea or vomiting Anesthetic complications: no     Last Vitals:  Vitals:   08/09/19 1046 08/09/19 1056  BP: 107/61 124/67  Pulse: 66 65  Resp: 14 16  Temp:    SpO2: 100% 100%    Last Pain:  Vitals:   08/09/19 1056  TempSrc:   PainSc: 0-No pain                 Cian Costanzo

## 2019-09-02 ENCOUNTER — Ambulatory Visit
Admission: RE | Admit: 2019-09-02 | Discharge: 2019-09-02 | Disposition: A | Discharge status: Home / Self Care | Payer: Medicare HMO | Source: Ambulatory Visit | Attending: Internal Medicine | Admitting: Internal Medicine

## 2019-09-02 DIAGNOSIS — Z1231 Encounter for screening mammogram for malignant neoplasm of breast: Secondary | ICD-10-CM

## 2020-12-14 DIAGNOSIS — M25562 Pain in left knee: Secondary | ICD-10-CM | POA: Diagnosis not present

## 2020-12-14 DIAGNOSIS — G8929 Other chronic pain: Secondary | ICD-10-CM | POA: Diagnosis not present

## 2020-12-14 DIAGNOSIS — M170 Bilateral primary osteoarthritis of knee: Secondary | ICD-10-CM | POA: Diagnosis not present

## 2020-12-14 DIAGNOSIS — M25561 Pain in right knee: Secondary | ICD-10-CM | POA: Diagnosis not present

## 2020-12-27 ENCOUNTER — Other Ambulatory Visit: Payer: Self-pay | Admitting: Internal Medicine

## 2020-12-27 DIAGNOSIS — Z1231 Encounter for screening mammogram for malignant neoplasm of breast: Secondary | ICD-10-CM

## 2021-01-01 DIAGNOSIS — I10 Essential (primary) hypertension: Secondary | ICD-10-CM | POA: Diagnosis not present

## 2021-01-01 DIAGNOSIS — R7309 Other abnormal glucose: Secondary | ICD-10-CM | POA: Diagnosis not present

## 2021-01-01 DIAGNOSIS — M170 Bilateral primary osteoarthritis of knee: Secondary | ICD-10-CM | POA: Diagnosis not present

## 2021-01-01 DIAGNOSIS — R8290 Unspecified abnormal findings in urine: Secondary | ICD-10-CM | POA: Diagnosis not present

## 2021-01-10 DIAGNOSIS — M25562 Pain in left knee: Secondary | ICD-10-CM | POA: Diagnosis not present

## 2021-01-10 DIAGNOSIS — G8929 Other chronic pain: Secondary | ICD-10-CM | POA: Diagnosis not present

## 2021-01-10 DIAGNOSIS — M25662 Stiffness of left knee, not elsewhere classified: Secondary | ICD-10-CM | POA: Diagnosis not present

## 2021-01-10 DIAGNOSIS — M6281 Muscle weakness (generalized): Secondary | ICD-10-CM | POA: Diagnosis not present

## 2021-01-10 DIAGNOSIS — M25561 Pain in right knee: Secondary | ICD-10-CM | POA: Diagnosis not present

## 2021-01-16 DIAGNOSIS — G8929 Other chronic pain: Secondary | ICD-10-CM | POA: Diagnosis not present

## 2021-01-16 DIAGNOSIS — M25562 Pain in left knee: Secondary | ICD-10-CM | POA: Diagnosis not present

## 2021-01-16 DIAGNOSIS — M25561 Pain in right knee: Secondary | ICD-10-CM | POA: Diagnosis not present

## 2021-01-24 DIAGNOSIS — G8929 Other chronic pain: Secondary | ICD-10-CM | POA: Diagnosis not present

## 2021-01-24 DIAGNOSIS — M25561 Pain in right knee: Secondary | ICD-10-CM | POA: Diagnosis not present

## 2021-01-24 DIAGNOSIS — M25562 Pain in left knee: Secondary | ICD-10-CM | POA: Diagnosis not present

## 2021-05-18 DIAGNOSIS — Z79899 Other long term (current) drug therapy: Secondary | ICD-10-CM | POA: Diagnosis not present

## 2021-05-18 DIAGNOSIS — R8290 Unspecified abnormal findings in urine: Secondary | ICD-10-CM | POA: Diagnosis not present

## 2021-05-18 DIAGNOSIS — R7309 Other abnormal glucose: Secondary | ICD-10-CM | POA: Diagnosis not present

## 2021-05-18 DIAGNOSIS — I10 Essential (primary) hypertension: Secondary | ICD-10-CM | POA: Diagnosis not present

## 2021-05-18 DIAGNOSIS — M170 Bilateral primary osteoarthritis of knee: Secondary | ICD-10-CM | POA: Diagnosis not present

## 2021-05-18 DIAGNOSIS — E559 Vitamin D deficiency, unspecified: Secondary | ICD-10-CM | POA: Diagnosis not present

## 2021-05-25 DIAGNOSIS — H04123 Dry eye syndrome of bilateral lacrimal glands: Secondary | ICD-10-CM | POA: Diagnosis not present

## 2021-05-25 DIAGNOSIS — H18513 Endothelial corneal dystrophy, bilateral: Secondary | ICD-10-CM | POA: Diagnosis not present

## 2021-05-25 DIAGNOSIS — H524 Presbyopia: Secondary | ICD-10-CM | POA: Diagnosis not present

## 2021-05-25 DIAGNOSIS — H43813 Vitreous degeneration, bilateral: Secondary | ICD-10-CM | POA: Diagnosis not present

## 2021-05-28 DIAGNOSIS — K5900 Constipation, unspecified: Secondary | ICD-10-CM | POA: Diagnosis not present

## 2021-05-28 DIAGNOSIS — M170 Bilateral primary osteoarthritis of knee: Secondary | ICD-10-CM | POA: Diagnosis not present

## 2021-05-28 DIAGNOSIS — Z13820 Encounter for screening for osteoporosis: Secondary | ICD-10-CM | POA: Diagnosis not present

## 2021-05-28 DIAGNOSIS — Z780 Asymptomatic menopausal state: Secondary | ICD-10-CM | POA: Diagnosis not present

## 2021-05-28 DIAGNOSIS — J309 Allergic rhinitis, unspecified: Secondary | ICD-10-CM | POA: Diagnosis not present

## 2021-05-28 DIAGNOSIS — Z1231 Encounter for screening mammogram for malignant neoplasm of breast: Secondary | ICD-10-CM | POA: Diagnosis not present

## 2021-05-28 DIAGNOSIS — I10 Essential (primary) hypertension: Secondary | ICD-10-CM | POA: Diagnosis not present

## 2021-05-28 DIAGNOSIS — Z0000 Encounter for general adult medical examination without abnormal findings: Secondary | ICD-10-CM | POA: Diagnosis not present

## 2021-05-28 DIAGNOSIS — L309 Dermatitis, unspecified: Secondary | ICD-10-CM | POA: Diagnosis not present

## 2021-06-26 ENCOUNTER — Ambulatory Visit
Admission: RE | Admit: 2021-06-26 | Discharge: 2021-06-26 | Disposition: A | Discharge status: Home / Self Care | Payer: Medicare HMO | Source: Ambulatory Visit | Attending: Internal Medicine | Admitting: Internal Medicine

## 2021-06-26 ENCOUNTER — Other Ambulatory Visit: Payer: Self-pay

## 2021-06-26 DIAGNOSIS — Z1231 Encounter for screening mammogram for malignant neoplasm of breast: Secondary | ICD-10-CM | POA: Insufficient documentation

## 2021-11-23 DIAGNOSIS — L309 Dermatitis, unspecified: Secondary | ICD-10-CM | POA: Diagnosis not present

## 2021-11-23 DIAGNOSIS — K5900 Constipation, unspecified: Secondary | ICD-10-CM | POA: Diagnosis not present

## 2021-11-23 DIAGNOSIS — Z780 Asymptomatic menopausal state: Secondary | ICD-10-CM | POA: Diagnosis not present

## 2021-11-23 DIAGNOSIS — M8589 Other specified disorders of bone density and structure, multiple sites: Secondary | ICD-10-CM | POA: Diagnosis not present

## 2021-11-23 DIAGNOSIS — M8588 Other specified disorders of bone density and structure, other site: Secondary | ICD-10-CM | POA: Diagnosis not present

## 2021-11-23 DIAGNOSIS — Z13820 Encounter for screening for osteoporosis: Secondary | ICD-10-CM | POA: Diagnosis not present

## 2021-11-23 DIAGNOSIS — I10 Essential (primary) hypertension: Secondary | ICD-10-CM | POA: Diagnosis not present

## 2021-11-23 DIAGNOSIS — M170 Bilateral primary osteoarthritis of knee: Secondary | ICD-10-CM | POA: Diagnosis not present

## 2021-11-23 DIAGNOSIS — R7309 Other abnormal glucose: Secondary | ICD-10-CM | POA: Diagnosis not present

## 2021-11-23 DIAGNOSIS — J3089 Other allergic rhinitis: Secondary | ICD-10-CM | POA: Diagnosis not present

## 2021-11-23 DIAGNOSIS — Z1231 Encounter for screening mammogram for malignant neoplasm of breast: Secondary | ICD-10-CM | POA: Diagnosis not present

## 2021-11-23 DIAGNOSIS — Z0000 Encounter for general adult medical examination without abnormal findings: Secondary | ICD-10-CM | POA: Diagnosis not present

## 2021-11-26 DIAGNOSIS — M8580 Other specified disorders of bone density and structure, unspecified site: Secondary | ICD-10-CM | POA: Diagnosis not present

## 2021-11-26 DIAGNOSIS — I10 Essential (primary) hypertension: Secondary | ICD-10-CM | POA: Diagnosis not present

## 2021-11-26 DIAGNOSIS — K219 Gastro-esophageal reflux disease without esophagitis: Secondary | ICD-10-CM | POA: Diagnosis not present

## 2021-11-26 DIAGNOSIS — M170 Bilateral primary osteoarthritis of knee: Secondary | ICD-10-CM | POA: Diagnosis not present

## 2022-01-14 DIAGNOSIS — H1132 Conjunctival hemorrhage, left eye: Secondary | ICD-10-CM | POA: Diagnosis not present

## 2022-01-14 DIAGNOSIS — H18513 Endothelial corneal dystrophy, bilateral: Secondary | ICD-10-CM | POA: Diagnosis not present

## 2022-05-27 DIAGNOSIS — R7309 Other abnormal glucose: Secondary | ICD-10-CM | POA: Diagnosis not present

## 2022-05-27 DIAGNOSIS — I10 Essential (primary) hypertension: Secondary | ICD-10-CM | POA: Diagnosis not present

## 2022-05-27 DIAGNOSIS — K219 Gastro-esophageal reflux disease without esophagitis: Secondary | ICD-10-CM | POA: Diagnosis not present

## 2022-05-27 DIAGNOSIS — M8589 Other specified disorders of bone density and structure, multiple sites: Secondary | ICD-10-CM | POA: Diagnosis not present

## 2022-05-27 DIAGNOSIS — Z79899 Other long term (current) drug therapy: Secondary | ICD-10-CM | POA: Diagnosis not present

## 2022-05-27 DIAGNOSIS — M170 Bilateral primary osteoarthritis of knee: Secondary | ICD-10-CM | POA: Diagnosis not present

## 2022-05-29 ENCOUNTER — Other Ambulatory Visit: Payer: Self-pay | Admitting: Internal Medicine

## 2022-05-29 DIAGNOSIS — Z1231 Encounter for screening mammogram for malignant neoplasm of breast: Secondary | ICD-10-CM

## 2022-05-29 DIAGNOSIS — M170 Bilateral primary osteoarthritis of knee: Secondary | ICD-10-CM | POA: Diagnosis not present

## 2022-05-29 DIAGNOSIS — L309 Dermatitis, unspecified: Secondary | ICD-10-CM | POA: Diagnosis not present

## 2022-05-29 DIAGNOSIS — R7309 Other abnormal glucose: Secondary | ICD-10-CM | POA: Diagnosis not present

## 2022-05-29 DIAGNOSIS — I10 Essential (primary) hypertension: Secondary | ICD-10-CM | POA: Diagnosis not present

## 2022-05-29 DIAGNOSIS — Z1331 Encounter for screening for depression: Secondary | ICD-10-CM | POA: Diagnosis not present

## 2022-05-29 DIAGNOSIS — Z23 Encounter for immunization: Secondary | ICD-10-CM | POA: Diagnosis not present

## 2022-05-29 DIAGNOSIS — Z0000 Encounter for general adult medical examination without abnormal findings: Secondary | ICD-10-CM | POA: Diagnosis not present

## 2022-05-29 DIAGNOSIS — K219 Gastro-esophageal reflux disease without esophagitis: Secondary | ICD-10-CM | POA: Diagnosis not present

## 2022-07-05 ENCOUNTER — Ambulatory Visit
Admission: RE | Admit: 2022-07-05 | Discharge: 2022-07-05 | Disposition: A | Discharge status: Home / Self Care | Payer: Medicare HMO | Source: Ambulatory Visit | Attending: Internal Medicine | Admitting: Internal Medicine

## 2022-07-05 DIAGNOSIS — Z1231 Encounter for screening mammogram for malignant neoplasm of breast: Secondary | ICD-10-CM | POA: Insufficient documentation

## 2023-07-03 ENCOUNTER — Other Ambulatory Visit: Payer: Self-pay | Admitting: Internal Medicine

## 2023-07-03 DIAGNOSIS — Z1231 Encounter for screening mammogram for malignant neoplasm of breast: Secondary | ICD-10-CM

## 2024-05-11 ENCOUNTER — Ambulatory Visit
Admission: RE | Admit: 2024-05-11 | Discharge: 2024-05-11 | Disposition: A | Discharge status: Home / Self Care | Source: Ambulatory Visit | Attending: Internal Medicine | Admitting: Internal Medicine

## 2024-05-11 DIAGNOSIS — Z1231 Encounter for screening mammogram for malignant neoplasm of breast: Secondary | ICD-10-CM | POA: Insufficient documentation
